# Patient Record
Sex: Female | Born: 1995 | Race: White | Hispanic: No | Marital: Single | Smoking: Never smoker
Health system: Southern US, Community
[De-identification: ages and names within clinical notes are randomized; demographics above are authoritative.]

## PROBLEM LIST (undated history)

## (undated) ENCOUNTER — Inpatient Hospital Stay (HOSPITAL_COMMUNITY): Payer: Self-pay

## (undated) DIAGNOSIS — N809 Endometriosis, unspecified: Secondary | ICD-10-CM

## (undated) DIAGNOSIS — F909 Attention-deficit hyperactivity disorder, unspecified type: Secondary | ICD-10-CM

## (undated) DIAGNOSIS — Z973 Presence of spectacles and contact lenses: Secondary | ICD-10-CM

## (undated) DIAGNOSIS — J302 Other seasonal allergic rhinitis: Secondary | ICD-10-CM

## (undated) DIAGNOSIS — J45909 Unspecified asthma, uncomplicated: Secondary | ICD-10-CM

## (undated) HISTORY — DX: Presence of spectacles and contact lenses: Z97.3

## (undated) HISTORY — PX: ADENOIDECTOMY: SUR15

## (undated) HISTORY — PX: TYMPANOSTOMY TUBE PLACEMENT: SHX32

---
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
## 2016-01-27 ENCOUNTER — Emergency Department (INDEPENDENT_AMBULATORY_CARE_PROVIDER_SITE_OTHER)
Admission: EM | Admit: 2016-01-27 | Discharge: 2016-01-27 | Disposition: A | Discharge status: Home / Self Care | Payer: BLUE CROSS/BLUE SHIELD | Source: Home / Self Care | Attending: Family Medicine | Admitting: Family Medicine

## 2016-01-27 ENCOUNTER — Emergency Department (INDEPENDENT_AMBULATORY_CARE_PROVIDER_SITE_OTHER): Payer: BLUE CROSS/BLUE SHIELD

## 2016-01-27 ENCOUNTER — Encounter: Payer: Self-pay | Admitting: Emergency Medicine

## 2016-01-27 DIAGNOSIS — R1030 Lower abdominal pain, unspecified: Secondary | ICD-10-CM

## 2016-01-27 DIAGNOSIS — R1013 Epigastric pain: Secondary | ICD-10-CM

## 2016-01-27 DIAGNOSIS — G43A0 Cyclical vomiting, not intractable: Secondary | ICD-10-CM

## 2016-01-27 DIAGNOSIS — R51 Headache: Secondary | ICD-10-CM

## 2016-01-27 DIAGNOSIS — R112 Nausea with vomiting, unspecified: Secondary | ICD-10-CM

## 2016-01-27 DIAGNOSIS — R102 Pelvic and perineal pain: Secondary | ICD-10-CM

## 2016-01-27 DIAGNOSIS — M25552 Pain in left hip: Secondary | ICD-10-CM

## 2016-01-27 DIAGNOSIS — R197 Diarrhea, unspecified: Secondary | ICD-10-CM

## 2016-01-27 DIAGNOSIS — R1115 Cyclical vomiting syndrome unrelated to migraine: Secondary | ICD-10-CM

## 2016-01-27 DIAGNOSIS — R519 Headache, unspecified: Secondary | ICD-10-CM

## 2016-01-27 LAB — POCT CBC W AUTO DIFF (K'VILLE URGENT CARE)

## 2016-01-27 LAB — POCT URINALYSIS DIP (MANUAL ENTRY)
Bilirubin, UA: NEGATIVE
Blood, UA: NEGATIVE
Glucose, UA: NEGATIVE
Ketones, POC UA: NEGATIVE
Leukocytes, UA: NEGATIVE
Nitrite, UA: NEGATIVE
Protein Ur, POC: NEGATIVE
Spec Grav, UA: 1.015 (ref 1.005–1.03)
Urobilinogen, UA: 0.2 (ref 0–1)
pH, UA: 7.5 (ref 5–8)

## 2016-01-27 LAB — POCT URINE PREGNANCY: Preg Test, Ur: NEGATIVE

## 2016-01-27 MED ORDER — ONDANSETRON HCL 4 MG PO TABS: 4.0000 mg | ORAL_TABLET | Freq: Four times a day (QID) | ORAL | 0 refills | Status: DC

## 2016-01-27 MED ORDER — DICYCLOMINE HCL 20 MG PO TABS: 20.0000 mg | ORAL_TABLET | Freq: Three times a day (TID) | ORAL | 0 refills | Status: DC

## 2016-01-27 MED ORDER — INFLUENZA VAC SPLIT QUAD 0.5 ML IM SUSY
0.5000 mL | PREFILLED_SYRINGE | INTRAMUSCULAR | Status: AC
  Administered 2016-01-27: 0.5 mL via INTRAMUSCULAR

## 2016-01-27 NOTE — ED Provider Notes (Signed)
CSN: [id number]     Arrival date & time 01/27/16  1448 History   First MD Initiated Contact with Patient 01/27/16 1503     Chief Complaint  Patient presents with  . Nausea  . Emesis  . Headache   (Consider location/radiation/quality/duration/timing/severity/associated sxs/prior Treatment) HPI  Connie Chapman is a 20 y.o. female presenting to UC with c/o intermittent nausea, vomiting, and generalized headache for about 2 weeks.  Pt notes she had similar symptoms about 2 months ago, was evaluated here at [facility]. Normal exam at that time and CBC at that time. She did not f/u with PCP as symptoms eventually resolved, but now have returned.  She was treated for a potential sinus infection at that time.  Today, pt denies nasal congestion, sore throat or cough. Denies urinary or vaginal symptoms.  She is sexually active, not on birth control, and having unprotected intercourse.  She denies concern for STDs.  Per PM[facility], pt does have hx of endometriosis.  Pt notes she is concerned about unexpected weight loss of the last few months as she notes she went from 198 to 182.  Per medical records, pt weighed 183 on 09/16/15.  Today, pt weighs 182.   Past Medical History:  Diagnosis Date  . ADD (attention deficit disorder with hyperactivity)   . Endometriosis   . Seasonal allergies   . Wears glasses    Goes to Dr. Karen Kitchens  . Wears glasses    Past Surgical History:  Procedure Laterality Date  . ADENOIDECTOMY    . TONSILLECTOMY  2007  . TYMPANOSTOMY TUBE PLACEMENT  2000, 2004  . TYMPANOSTOMY TUBE PLACEMENT     Family History  Problem Relation Age of Onset  . Hearing loss Mother   . Scoliosis Mother   . Endometriosis Mother   . Hypertension Mother   . Other Mother     Tumor Cerebri  . Hypertension    . Thyroid disease    . Heart attack     Social History  Substance Use Topics  . Smoking status: Passive Smoke Exposure - Never Smoker  . Smokeless tobacco: Never Used  . Alcohol use No   OB  History    No data available     Review of Systems  Constitutional: Negative for chills, fever and unexpected weight change.  HENT: Negative for congestion, ear pain, sore throat, trouble swallowing and voice change.   Respiratory: Negative for cough and shortness of breath.   Cardiovascular: Negative for chest pain and palpitations.  Gastrointestinal: Positive for abdominal pain, nausea and vomiting. Negative for blood in stool and diarrhea (loose stool).  Genitourinary: Positive for pelvic pain ( lower abdomen). Negative for dysuria, flank pain, frequency, hematuria, menstrual problem and urgency.  Musculoskeletal: Negative for arthralgias, back pain and myalgias.  Skin: Negative for rash.  Neurological: Positive for headaches. Negative for dizziness, seizures, syncope and light-headedness.    Allergies  Codeine  Home Medications   Prior to Admission medications   Medication Sig Start Date End Date Taking? Authorizing Provider  dicyclomine (BENTYL) 20 MG tablet Take 1 tablet (20 mg total) by mouth 4 (four) times daily -  before meals and at bedtime. 01/27/16   Junius Finner, PA-C  ondansetron (ZOFRAN) 4 MG tablet Take 1 tablet (4 mg total) by mouth every 6 (six) hours. 01/27/16   Junius Finner, PA-C   Meds Ordered and Administered this Visit   Medications  Influenza vac split quadrivalent PF (FLUARIX) injection 0.5 mL (0.5 mLs  Intramuscular Given 01/27/16 1702)    BP 133/89 (BP Location: Left Arm)   Temp 97.7 F (36.5 C) (Oral)   Ht 5\' 4"  (1.626 m)   Wt 182 lb (82.6 kg)   LMP 01/02/2016 (Exact Date)   SpO2 99%   BMI 31.24 kg/m  No data found.   Physical Exam  Constitutional: She is oriented to person, place, and time. She appears well-developed and well-nourished. No distress.  Pt sitting on exam bed, NAD  HENT:  Head: Normocephalic and atraumatic.  Mouth/Throat: Oropharynx is clear and moist.  Eyes: Conjunctivae and EOM are normal. Pupils are equal, round, and  reactive to light. No scleral icterus.  Neck: Normal range of motion. Neck supple.  Cardiovascular: Normal rate, regular rhythm and normal heart sounds.   Pulmonary/Chest: Effort normal and breath sounds normal. No respiratory distress. She has no wheezes. She has no rales.  Abdominal: Soft. Bowel sounds are normal. She exhibits no distension and no mass. There is tenderness ( mild LUQ and LLQ). There is no rebound, no guarding and no CVA tenderness.  Musculoskeletal: Normal range of motion.  Neurological: She is alert and oriented to person, place, and time.  Skin: Skin is warm and dry. She is not diaphoretic.  Nursing note and vitals reviewed.   Urgent Care Course   Clinical Course    Procedures (including critical care time)  Labs Review Labs Reviewed  TSH  COMPREHENSIVE METABOLIC PANEL  POCT URINALYSIS DIP (MANUAL ENTRY)  POCT URINE PREGNANCY  POCT CBC W AUTO DIFF ([facility])    Imaging Review US Transvaginal Non-ob  Result Date: 01/27/2016 CLINICAL DATA:  Left lower quadrant pain for 2 weeks EXAM: TRANSABDOMINAL AND TRANSVAGINAL ULTRASOUND OF PELVIS DOPPLER ULTRASOUND OF OVARIES TECHNIQUE: Both transabdominal and transvaginal ultrasound examinations of the pelvis were performed. Transabdominal technique was performed for global imaging of the pelvis including uterus, ovaries, adnexal regions, and pelvic cul-de-sac. It was necessary to proceed with endovaginal exam following the transabdominal exam to visualize the uterus, endometrium and ovaries. Color and duplex Doppler ultrasound was utilized to evaluate blood flow to the ovaries. COMPARISON:  None. FINDINGS: Uterus Measurements: 7.7 x 4.0 x 4.4 cm. No fibroids or other mass visualized. Endometrium Thickness: 10.8 mm.  No focal abnormality visualized. Right ovary Measurements: 3.6 x 2.1 x 2.0 cm. Normal appearance/no adnexal mass. Left ovary Measurements: 3.0 x 1.6 x 1.7 cm. Normal appearance/no adnexal mass. Pulsed  Doppler evaluation of both ovaries demonstrates normal low-resistance arterial and venous waveforms. Other findings Trace free fluid identified within the pelvis. IMPRESSION: 1. No evidence for ovarian torsion. Electronically Signed   By: Signa Kell M.D.   On: 01/27/2016 16:26   US Pelvis Complete  Result Date: 01/27/2016 CLINICAL DATA:  LEFT lower quadrant pelvic pain for 2 weeks. Assess for ovarian torsion. History of endometriosis. EXAM: TRANSABDOMINAL AND TRANSVAGINAL ULTRASOUND OF PELVIS DOPPLER ULTRASOUND OF OVARIES TECHNIQUE: Both transabdominal and transvaginal ultrasound examinations of the pelvis were performed. Transabdominal technique was performed for global imaging of the pelvis including uterus, ovaries, adnexal regions, and pelvic cul-de-sac. It was necessary to proceed with endovaginal exam following the transabdominal exam to visualize the adnexae. Color and duplex Doppler ultrasound was utilized to evaluate blood flow to the ovaries. COMPARISON:  None. FINDINGS: Uterus Measurements: 7.7 x 4 x 4.4 cm. No fibroids or other mass visualized. Endometrium Thickness: 11 mm.  No focal abnormality visualized. Right ovary Measurements: 3.6 x 2.1 x 2 cm. Normal appearance/no adnexal mass. Left ovary  Measurements: 3 x 1.6 x 1.7 cm. Normal appearance/no adnexal mass. Pulsed Doppler evaluation of both ovaries demonstrates normal low-resistance arterial and venous waveforms. Other findings No abnormal free fluid.  Multiple loops of stool-filled bowel. IMPRESSION: Stool-filled bowel could be characterized with radiograph if clinical concern for constipation. Otherwise negative pelvic ultrasound. Electronically Signed   By: Awilda Metroourtnay  Bloomer M.D.   On: 01/27/2016 16:11   [place] Art/ven Flow Abd Pelv Doppler  Result Date: 01/27/2016 CLINICAL DATA:  LEFT lower quadrant pelvic pain for 2 weeks. Assess for ovarian torsion. History of endometriosis. EXAM: TRANSABDOMINAL AND TRANSVAGINAL ULTRASOUND OF PELVIS  DOPPLER ULTRASOUND OF OVARIES TECHNIQUE: Both transabdominal and transvaginal ultrasound examinations of the pelvis were performed. Transabdominal technique was performed for global imaging of the pelvis including uterus, ovaries, adnexal regions, and pelvic cul-de-sac. It was necessary to proceed with endovaginal exam following the transabdominal exam to visualize the adnexae. Color and duplex Doppler ultrasound was utilized to evaluate blood flow to the ovaries. COMPARISON:  None. FINDINGS: Uterus Measurements: 7.7 x 4 x 4.4 cm. No fibroids or other mass visualized. Endometrium Thickness: 11 mm.  No focal abnormality visualized. Right ovary Measurements: 3.6 x 2.1 x 2 cm. Normal appearance/no adnexal mass. Left ovary Measurements: 3 x 1.6 x 1.7 cm. Normal appearance/no adnexal mass. Pulsed Doppler evaluation of both ovaries demonstrates normal low-resistance arterial and venous waveforms. Other findings No abnormal free fluid.  Multiple loops of stool-filled bowel. IMPRESSION: Stool-filled bowel could be characterized with radiograph if clinical concern for constipation. Otherwise negative pelvic ultrasound. Electronically Signed   By: Awilda Metroourtnay  Bloomer M.D.   On: 01/27/2016 16:11     MDM   1. Non-intractable cyclical vomiting with nausea   2. Nausea vomiting and diarrhea   3. Lower abdominal pain   4. Epigastric pain   5. Pelvic joint pain, left   6. Generalized headache    Pt c/o n/v/d, generalized headache, epigastric pain and lower abdominal pain for about 2 weeks. Hx of same in the past, has not f/u with PCP. Pt does have unprotected intercourse but denies vaginal symptoms. Denies concern for STD. Pt appears well hydrated, NAD. Vitals: WNL  U/S- normal, mention of stool, pt declines feeling constipated.  Does not want to be tested for STDs. Declined pelvic exam, hx of endometriosis, plans to f/u with OB/GYN  No vomiting while in UC, able to keep down several ounces of water and ginger-ale.    CBC- unremarkable  CMP and TSH- pending  Doubt emergent process taking place at this time.   Rx: Zofran and Bentyl Encouraged to schedule f/u appointment with PCP even if feeling better as well as with GYN Patient verbalized understanding and agreement with treatment plan.    Junius FinnerErin O'Malley, PA-C 01/27/16 1718

## 2016-01-27 NOTE — ED Triage Notes (Signed)
Constant nausea, vomiting after meals, headaches, body aches, dull pain in lower abdomen x 2 weeks. Sexually active does not use protection.

## 2016-01-28 ENCOUNTER — Telehealth: Payer: Self-pay | Admitting: *Deleted

## 2016-01-28 LAB — TSH: TSH: 1.26 mIU/L (ref 0.50–4.30)

## 2016-01-28 LAB — COMPREHENSIVE METABOLIC PANEL
ALT: 18 U/L (ref 5–32)
AST: 20 U/L (ref 12–32)
Albumin: 4.1 g/dL (ref 3.6–5.1)
Alkaline Phosphatase: 62 U/L (ref 47–176)
BUN: 9 mg/dL (ref 7–20)
CO2: 23 mmol/L (ref 20–31)
Calcium: 9 mg/dL (ref 8.9–10.4)
Chloride: 107 mmol/L (ref 98–110)
Creat: 0.62 mg/dL (ref 0.50–1.00)
Glucose, Bld: 76 mg/dL (ref 65–99)
Potassium: 4.1 mmol/L (ref 3.8–5.1)
Sodium: 138 mmol/L (ref 135–146)
Total Bilirubin: 0.5 mg/dL (ref 0.2–1.1)
Total Protein: 6.9 g/dL (ref 6.3–8.2)

## 2016-01-28 NOTE — Telephone Encounter (Signed)
Callback: No answer, no VM available.

## 2016-01-29 ENCOUNTER — Telehealth: Payer: Self-pay | Admitting: *Deleted

## 2016-01-29 NOTE — Telephone Encounter (Signed)
LM with pts father to have her call back for lab results. Clemens Catholichristy Livier Hendel, LPN

## 2016-07-20 ENCOUNTER — Encounter: Payer: Self-pay | Admitting: *Deleted

## 2016-07-20 ENCOUNTER — Emergency Department (INDEPENDENT_AMBULATORY_CARE_PROVIDER_SITE_OTHER)
Admission: EM | Admit: 2016-07-20 | Discharge: 2016-07-20 | Disposition: A | Discharge status: Home / Self Care | Payer: BLUE CROSS/BLUE SHIELD | Source: Home / Self Care | Attending: Family Medicine | Admitting: Family Medicine

## 2016-07-20 DIAGNOSIS — R6889 Other general symptoms and signs: Secondary | ICD-10-CM

## 2016-07-20 DIAGNOSIS — R197 Diarrhea, unspecified: Secondary | ICD-10-CM

## 2016-07-20 DIAGNOSIS — R112 Nausea with vomiting, unspecified: Secondary | ICD-10-CM

## 2016-07-20 MED ORDER — OSELTAMIVIR PHOSPHATE 75 MG PO CAPS: 75.0000 mg | ORAL_CAPSULE | Freq: Two times a day (BID) | ORAL | 0 refills | Status: DC

## 2016-07-20 MED ORDER — PROMETHAZINE HCL 25 MG PO TABS: 25.0000 mg | ORAL_TABLET | Freq: Four times a day (QID) | ORAL | 0 refills | Status: DC | PRN

## 2016-07-20 NOTE — Discharge Instructions (Signed)
You may take 400-600mg  Ibuprofen (Motrin) every 6-8 hours for fever and pain  Alternate with Tylenol  You may take  Tylenol every 4-6 hours as needed for fever and pain  Follow-up with your primary care provider next week for recheck of symptoms if not improving.  Be sure to drink plenty of fluids and rest, at least 8hrs of sleep a night, preferably more while you are sick. Return urgent care or go to closest ER if you cannot keep down fluids/signs of dehydration, fever not reducing with Tylenol, difficulty breathing/wheezing, stiff neck, worsening condition, or other concerns (see below)   For the diarrhea you can try over the counter imodium or pepto-bismol if having more than 5-6 episodes a day.  Oseltamivir (Tamiflu) is an antiviral medication that can help decrease symptoms of the flu by about 1 days and lessen severity of symptoms.  It is best to start this medication within first 48 hours of symptoms, however, some studies have shown relief even after the 48 hour time frame.  This medication may cause stomach upset including nausea, vomiting and diarrhea.  It may also cause dizziness or hallucinations in children.  To help prevent stomach upset, you may take this medication with food.  If you are still having unwanted symptoms, you may stop taking this medication as it is not as important to finish the entire course like antibiotics.  If you have questions/concerns please call our office or follow up with your primary care provider.

## 2016-07-20 NOTE — ED Provider Notes (Signed)
CSN: [id number]     Arrival date & time 07/20/16  1413 History   First MD Initiated Contact with Patient 07/20/16 1432     Chief Complaint  Patient presents with  . Diarrhea  . Emesis   (Consider location/radiation/quality/duration/timing/severity/associated sxs/prior Treatment) HPI Connie Chapman is a 21 y.o. female presenting to UC with c/o 3 days of n/v/d, nasal congestion, cough, rhinorrhea and body aches with fever up to 102*F at night only.  She has had 2-3 episodes of vomiting and 6-7 episodes of watery diarrhea today. No blood or mucous in the stool.  Denies known sick contacts. Denies chest pain or SOB. Denies abdominal pain at this time.     Past Medical History:  Diagnosis Date  . ADD (attention deficit disorder with hyperactivity)   . Endometriosis   . Seasonal allergies   . Wears glasses    Goes to Dr. Karen Kitchens  . Wears glasses    Past Surgical History:  Procedure Laterality Date  . ADENOIDECTOMY    . TONSILLECTOMY  2007  . TYMPANOSTOMY TUBE PLACEMENT  2000, 2004  . TYMPANOSTOMY TUBE PLACEMENT     Family History  Problem Relation Age of Onset  . Hearing loss Mother   . Scoliosis Mother   . Endometriosis Mother   . Hypertension Mother   . Other Mother     Tumor Cerebri  . Hypertension    . Thyroid disease    . Heart attack     Social History  Substance Use Topics  . Smoking status: Passive Smoke Exposure - Never Smoker  . Smokeless tobacco: Never Used  . Alcohol use No   OB History    No data available     Review of Systems  Constitutional: Positive for chills and fever.  HENT: Positive for congestion and rhinorrhea. Negative for ear pain, sore throat, trouble swallowing and voice change.   Respiratory: Positive for cough. Negative for shortness of breath.   Cardiovascular: Negative for chest pain and palpitations.  Gastrointestinal: Positive for diarrhea, nausea and vomiting. Negative for abdominal pain.  Musculoskeletal: Positive for  arthralgias, back pain and myalgias.  Skin: Negative for rash.  Neurological: Positive for headaches. Negative for dizziness and light-headedness.    Allergies  Codeine  Home Medications   Prior to Admission medications   Medication Sig Start Date End Date Taking? Authorizing Provider  oseltamivir (TAMIFLU) 75 MG capsule Take 1 capsule (75 mg total) by mouth every 12 (twelve) hours. 07/20/16   Junius Finner, PA-C  promethazine (PHENERGAN) 25 MG tablet Take 1 tablet (25 mg total) by mouth every 6 (six) hours as needed. 07/20/16   Junius Finner, PA-C   Meds Ordered and Administered this Visit  Medications - No data to display  BP 129/90 (BP Location: Left Arm)   Pulse 90   Temp 97.9 F (36.6 C) (Oral)   Resp 16   Ht  (1.651 m)   Wt 190 lb (86.2 kg)   LMP 07/13/2016   SpO2 97%   BMI 31.62 kg/m  No data found.   Physical Exam  Constitutional: She is oriented to person, place, and time. She appears well-developed and well-nourished. No distress.  Pt sitting on exam bed sipping on ginger-ale. NAD  HENT:  Head: Normocephalic and atraumatic.  Right Ear: Tympanic membrane normal.  Left Ear: Tympanic membrane normal.  Nose: Nose normal.  Mouth/Throat: Uvula is midline, oropharynx is clear and moist and mucous membranes are normal.  Eyes: EOM are  normal.  Neck: Normal range of motion. Neck supple.  Cardiovascular: Normal rate and regular rhythm.   Pulmonary/Chest: Effort normal and breath sounds normal. No stridor. No respiratory distress. She has no wheezes. She has no rales.  Abdominal: Soft. She exhibits no distension and no mass. There is no tenderness. There is no rebound and no guarding.  Musculoskeletal: Normal range of motion.  Lymphadenopathy:    She has no cervical adenopathy.  Neurological: She is alert and oriented to person, place, and time.  Skin: Skin is warm and dry. She is not diaphoretic.  Psychiatric: She has a normal mood and affect. Her behavior is  normal.  Nursing note and vitals reviewed.   Urgent Care Course     Procedures (including critical care time)  Labs Review Labs Reviewed - No data to display  Imaging Review No results found.   MDM   1. Flu-like symptoms   2. Nausea vomiting and diarrhea    Vitals: unremarkable  Hx and exam c/w influenza. There have been several positive cases of influenza B within the last 2 weeks.  Will treat for potential flu.  Rx: Tamiflu and phenergan  Home care instructions provided. f/u with PCP later this week if not improving.     Junius Finner, PA-C 07/20/16 1724

## 2016-07-20 NOTE — ED Triage Notes (Signed)
Pt c/o diarrhea and vomiting x 3 days with fever up to 102 only at night. She reports cough and runny nose today.

## 2017-01-11 ENCOUNTER — Ambulatory Visit: Payer: BLUE CROSS/BLUE SHIELD | Admitting: Family Medicine

## 2017-01-11 DIAGNOSIS — Z0189 Encounter for other specified special examinations: Secondary | ICD-10-CM

## 2017-01-11 NOTE — Progress Notes (Deleted)
   Subjective:    Patient ID: Connie Chapman, female    DOB: July 17, 1995, 21 y.o.   MRN: [id number]  HPI F/U ADD -    Review of Systems     Objective:   Physical Exam  Constitutional: She is oriented to person, place, and time. She appears well-developed and well-nourished.  HENT:  Head: Normocephalic and atraumatic.  Cardiovascular: Normal rate, regular rhythm and normal heart sounds.   Pulmonary/Chest: Effort normal and breath sounds normal.  Neurological: She is alert and oriented to person, place, and time.  Skin: Skin is warm and dry.  Psychiatric: She has a normal mood and affect. Her behavior is normal.          Assessment & Plan:  ADD -

## 2017-06-07 ENCOUNTER — Encounter: Payer: Self-pay | Admitting: Physician Assistant

## 2017-06-07 ENCOUNTER — Ambulatory Visit (INDEPENDENT_AMBULATORY_CARE_PROVIDER_SITE_OTHER): Payer: BLUE CROSS/BLUE SHIELD | Admitting: Physician Assistant

## 2017-06-07 VITALS — BP 136/84 | HR 85 | Ht 65.0 in | Wt 188.0 lb

## 2017-06-07 DIAGNOSIS — N898 Other specified noninflammatory disorders of vagina: Secondary | ICD-10-CM

## 2017-06-07 DIAGNOSIS — R300 Dysuria: Secondary | ICD-10-CM | POA: Diagnosis not present

## 2017-06-07 LAB — POCT URINALYSIS DIPSTICK
BILIRUBIN UA: NEGATIVE
Blood, UA: NEGATIVE
Glucose, UA: NEGATIVE
KETONES UA: NEGATIVE
Leukocytes, UA: NEGATIVE
Nitrite, UA: NEGATIVE
PROTEIN UA: 30
Urobilinogen, UA: 0.2 E.U./dL
pH, UA: 6 (ref 5.0–8.0)

## 2017-06-07 MED ORDER — FLUCONAZOLE 150 MG PO TABS: 150.0000 mg | ORAL_TABLET | Freq: Once | ORAL | 0 refills | Status: AC

## 2017-06-07 MED ORDER — PHENAZOPYRIDINE HCL 200 MG PO TABS: 200.0000 mg | ORAL_TABLET | Freq: Three times a day (TID) | ORAL | 0 refills | Status: AC

## 2017-06-07 NOTE — Patient Instructions (Addendum)
Call [phone] Vaginal Yeast infection, Adult Vaginal yeast infection is a condition that causes soreness, swelling, and redness (inflammation) of the vagina. It also causes vaginal discharge. This is a common condition. Some women get this infection frequently. What are the causes? This condition is caused by a change in the normal balance of the yeast (candida) and bacteria that live in the vagina. This change causes an overgrowth of yeast, which causes the inflammation. What increases the risk? This condition is more likely to develop in:  Women who take antibiotic medicines.  Women who have diabetes.  Women who take birth control pills.  Women who are pregnant.  Women who douche often.  Women who have a weak defense (immune) system.  Women who have been taking steroid medicines for a long time.  Women who frequently wear tight clothing.  What are the signs or symptoms? Symptoms of this condition include:  White, thick vaginal discharge.  Swelling, itching, redness, and irritation of the vagina. The lips of the vagina (vulva) may be affected as well.  Pain or a burning feeling while urinating.  Pain during sex.  How is this diagnosed? This condition is diagnosed with a medical history and physical exam. This will include a pelvic exam. Your health care provider will examine a sample of your vaginal discharge under a microscope. Your health care provider may send this sample for testing to confirm the diagnosis. How is this treated? This condition is treated with medicine. Medicines may be over-the-counter or prescription. You may be told to use one or more of the following:  Medicine that is taken orally.  Medicine that is applied as a cream.  Medicine that is inserted directly into the vagina (suppository).  Follow these instructions at home:  Take or apply over-the-counter and prescription medicines only as told by your health care provider.  Do not have sex  until your health care provider has approved. Tell your sex partner that you have a yeast infection. That person should go to his or her health care provider if he or she develops symptoms.  Do not wear tight clothes, such as pantyhose or tight pants.  Avoid using tampons until your health care provider approves.  Eat more yogurt. This may help to keep your yeast infection from returning.  Try taking a sitz bath to help with discomfort. This is a warm water bath that is taken while you are sitting down. The water should only come up to your hips and should cover your buttocks. Do this 3-4 times per day or as told by your health care provider.  Do not douche.  Wear breathable, cotton underwear.  If you have diabetes, keep your blood sugar levels under control. Contact a health care provider if:  You have a fever.  Your symptoms go away and then return.  Your symptoms do not get better with treatment.  Your symptoms get worse.  You have new symptoms.  You develop blisters in or around your vagina.  You have blood coming from your vagina and it is not your menstrual period.  You develop pain in your abdomen. This information is not intended to replace advice given to you by your health care provider. Make sure you discuss any questions you have with your health care provider. Document Released: 01/07/2005 Document Revised: 09/11/2015 Document Reviewed: 10/01/2014 Elsevier Interactive Patient Education  2018 [employer]
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
## 2017-06-08 ENCOUNTER — Other Ambulatory Visit: Payer: Self-pay | Admitting: Physician Assistant

## 2017-06-08 LAB — WET PREP FOR TRICH, YEAST, CLUE
MICRO NUMBER: 90247486
Specimen Quality: ADEQUATE

## 2017-06-08 MED ORDER — METRONIDAZOLE 500 MG PO TABS: 500.0000 mg | ORAL_TABLET | Freq: Two times a day (BID) | ORAL | 0 refills | Status: DC

## 2017-06-08 NOTE — Progress Notes (Signed)
Call pt: BV detected. Will treat with metronidazole 500mg  bid for 7 days if symptoms persist or worsen please follow up for STD testing.

## 2017-06-08 NOTE — Progress Notes (Signed)
met

## 2017-06-09 LAB — URINE CULTURE
MICRO NUMBER: 90244809
SPECIMEN QUALITY:: ADEQUATE

## 2017-06-09 NOTE — Progress Notes (Signed)
Call pt: no bacterial infected detected on culture.

## 2017-06-24 ENCOUNTER — Emergency Department (INDEPENDENT_AMBULATORY_CARE_PROVIDER_SITE_OTHER)
Admission: EM | Admit: 2017-06-24 | Discharge: 2017-06-24 | Disposition: A | Discharge status: Home / Self Care | Payer: BLUE CROSS/BLUE SHIELD | Source: Home / Self Care | Attending: Emergency Medicine | Admitting: Emergency Medicine

## 2017-06-24 ENCOUNTER — Other Ambulatory Visit: Payer: Self-pay

## 2017-06-24 ENCOUNTER — Encounter: Payer: Self-pay | Admitting: Emergency Medicine

## 2017-06-24 DIAGNOSIS — R197 Diarrhea, unspecified: Secondary | ICD-10-CM

## 2017-06-24 DIAGNOSIS — J029 Acute pharyngitis, unspecified: Secondary | ICD-10-CM | POA: Diagnosis not present

## 2017-06-24 DIAGNOSIS — R1115 Cyclical vomiting syndrome unrelated to migraine: Secondary | ICD-10-CM

## 2017-06-24 LAB — POCT RAPID STREP A (OFFICE): Rapid Strep A Screen: NEGATIVE

## 2017-06-24 LAB — POCT INFLUENZA A/B
Influenza A, POC: NEGATIVE
Influenza B, POC: NEGATIVE

## 2017-06-24 MED ORDER — HYOSCYAMINE SULFATE SL 0.125 MG SL SUBL: SUBLINGUAL_TABLET | SUBLINGUAL | 0 refills | Status: DC

## 2017-06-24 MED ORDER — ONDANSETRON 8 MG PO TBDP: ORAL_TABLET | ORAL | 0 refills | Status: DC

## 2017-06-24 NOTE — Discharge Instructions (Signed)
You have Zofran to take for nausea. Be sure you drink lots of clear liquids. You have a Levsin to use for crampy abdominal pain. Return to clinic if you develop bloody diarrhea or do not improve in the next 48 hours.
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
## 2017-06-24 NOTE — ED Triage Notes (Signed)
Reports 4 days of diarrhea, sore throat, and headache. Took ibuprofen at 1530.

## 2017-06-25 ENCOUNTER — Telehealth: Payer: Self-pay

## 2017-06-25 LAB — STREP A DNA PROBE: GROUP A STREP PROBE: NOT DETECTED

## 2017-06-25 NOTE — Telephone Encounter (Signed)
Called patient, feeling better.  Lab results given.

## 2017-06-30 ENCOUNTER — Ambulatory Visit (HOSPITAL_COMMUNITY): Payer: BLUE CROSS/BLUE SHIELD | Admitting: Psychiatry

## 2017-08-31 ENCOUNTER — Emergency Department (INDEPENDENT_AMBULATORY_CARE_PROVIDER_SITE_OTHER)
Admission: EM | Admit: 2017-08-31 | Discharge: 2017-08-31 | Disposition: A | Discharge status: Home / Self Care | Payer: BLUE CROSS/BLUE SHIELD | Source: Home / Self Care | Attending: Family Medicine | Admitting: Family Medicine

## 2017-08-31 ENCOUNTER — Other Ambulatory Visit: Payer: Self-pay

## 2017-08-31 DIAGNOSIS — R519 Headache, unspecified: Secondary | ICD-10-CM

## 2017-08-31 DIAGNOSIS — R109 Unspecified abdominal pain: Secondary | ICD-10-CM

## 2017-08-31 DIAGNOSIS — N898 Other specified noninflammatory disorders of vagina: Secondary | ICD-10-CM

## 2017-08-31 DIAGNOSIS — R51 Headache: Secondary | ICD-10-CM

## 2017-08-31 LAB — POCT WET + KOH PREP: Trich by wet prep: ABSENT

## 2017-08-31 LAB — POCT URINALYSIS DIP (MANUAL ENTRY)
BILIRUBIN UA: NEGATIVE
GLUCOSE UA: NEGATIVE mg/dL
Ketones, POC UA: NEGATIVE mg/dL
Leukocytes, UA: NEGATIVE
NITRITE UA: NEGATIVE
Protein Ur, POC: 30 mg/dL — AB
RBC UA: NEGATIVE
SPEC GRAV UA: 1.025 (ref 1.010–1.025)
Urobilinogen, UA: 0.2 E.U./dL — AB
pH, UA: 6.5 (ref 5.0–8.0)

## 2017-08-31 LAB — POCT URINE PREGNANCY: Preg Test, Ur: NEGATIVE

## 2017-08-31 MED ORDER — FLUCONAZOLE 150 MG PO TABS: ORAL_TABLET | ORAL | 0 refills | Status: DC

## 2017-08-31 NOTE — ED Triage Notes (Signed)
Pt has had lower abdominal pain for about 1 week.  Dark brown vaginal discharge about the same time.  Denies concern for STD.
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
## 2017-08-31 NOTE — Discharge Instructions (Addendum)
For headache, may take Ibuprofen , 4 tabs every 8 hours with food, or Aleve 2 tabs every 12 hours as needed.

## 2017-09-01 ENCOUNTER — Ambulatory Visit: Payer: BLUE CROSS/BLUE SHIELD | Admitting: Physician Assistant

## 2017-11-16 ENCOUNTER — Emergency Department
Admission: EM | Admit: 2017-11-16 | Discharge: 2017-11-16 | Disposition: A | Discharge status: Home / Self Care | Payer: BLUE CROSS/BLUE SHIELD | Attending: Emergency Medicine | Admitting: Emergency Medicine

## 2017-11-16 ENCOUNTER — Emergency Department: Payer: BLUE CROSS/BLUE SHIELD

## 2017-11-16 ENCOUNTER — Other Ambulatory Visit: Payer: Self-pay

## 2017-11-16 DIAGNOSIS — R569 Unspecified convulsions: Secondary | ICD-10-CM

## 2017-11-16 DIAGNOSIS — R55 Syncope and collapse: Secondary | ICD-10-CM | POA: Diagnosis not present

## 2017-11-16 DIAGNOSIS — N809 Endometriosis, unspecified: Secondary | ICD-10-CM | POA: Insufficient documentation

## 2017-11-16 DIAGNOSIS — Z7722 Contact with and (suspected) exposure to environmental tobacco smoke (acute) (chronic): Secondary | ICD-10-CM | POA: Insufficient documentation

## 2017-11-16 LAB — URINALYSIS, COMPLETE (UACMP) WITH MICROSCOPIC
BILIRUBIN URINE: NEGATIVE
GLUCOSE, UA: NEGATIVE mg/dL
HGB URINE DIPSTICK: NEGATIVE
KETONES UR: 5 mg/dL — AB
Leukocytes, UA: NEGATIVE
NITRITE: NEGATIVE
Protein, ur: 30 mg/dL — AB
SPECIFIC GRAVITY, URINE: 1.03 (ref 1.005–1.030)
pH: 6 (ref 5.0–8.0)

## 2017-11-16 LAB — BASIC METABOLIC PANEL
Anion gap: 7 (ref 5–15)
BUN: 12 mg/dL (ref 6–20)
CALCIUM: 8.3 mg/dL — AB (ref 8.9–10.3)
CHLORIDE: 110 mmol/L (ref 98–111)
CO2: 23 mmol/L (ref 22–32)
CREATININE: 0.57 mg/dL (ref 0.44–1.00)
GFR calc non Af Amer: >60 mL/min (ref >60)
GLUCOSE: 109 mg/dL — AB (ref 70–99)
Potassium: 3.8 mmol/L (ref 3.5–5.1)
Sodium: 140 mmol/L (ref 135–145)

## 2017-11-16 LAB — CBC WITH DIFFERENTIAL/PLATELET
Basophils Absolute: 0 10*3/uL (ref 0–0.1)
Basophils Relative: 0 %
EOS ABS: 0.3 10*3/uL (ref 0–0.7)
Eosinophils Relative: 2 %
HCT: 42.3 % (ref 35.0–47.0)
Hemoglobin: 14.4 g/dL (ref 12.0–16.0)
LYMPHS ABS: 1.8 10*3/uL (ref 1.0–3.6)
Lymphocytes Relative: 12 %
MCH: 28.6 pg (ref 26.0–34.0)
MCHC: 34 g/dL (ref 32.0–36.0)
MCV: 84.1 fL (ref 80.0–100.0)
MONO ABS: 0.7 10*3/uL (ref 0.2–0.9)
MONOS PCT: 5 %
Neutro Abs: 12.6 10*3/uL — ABNORMAL HIGH (ref 1.4–6.5)
Neutrophils Relative %: 81 %
PLATELETS: 276 10*3/uL (ref 150–440)
RBC: 5.04 MIL/uL (ref 3.80–5.20)
RDW: 13.8 % (ref 11.5–14.5)
WBC: 15.4 10*3/uL — ABNORMAL HIGH (ref 3.6–11.0)

## 2017-11-16 LAB — POCT PREGNANCY, URINE: PREG TEST UR: NEGATIVE

## 2017-11-16 MED ORDER — DIPHENHYDRAMINE HCL 50 MG/ML IJ SOLN
25.0000 mg | Freq: Once | INTRAMUSCULAR | Status: AC
  Administered 2017-11-16: 25 mg via INTRAVENOUS
  Filled 2017-11-16: qty 1

## 2017-11-16 MED ORDER — METOCLOPRAMIDE HCL 5 MG/ML IJ SOLN
10.0000 mg | Freq: Once | INTRAMUSCULAR | Status: AC
  Administered 2017-11-16: 10 mg via INTRAVENOUS
  Filled 2017-11-16: qty 2

## 2017-11-16 MED ORDER — SODIUM CHLORIDE 0.9 % IV BOLUS
1000.0000 mL | Freq: Once | INTRAVENOUS | Status: AC
  Administered 2017-11-16: 1000 mL via INTRAVENOUS
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
## 2017-11-16 NOTE — ED Triage Notes (Signed)
Pt arrived via  EMS [place] [address] with c/o seizure. EMS states that pt was driving with fiance and she started twitching and hyperventilating. Pt states that she has pain in head.

## 2017-11-16 NOTE — ED Notes (Signed)
Patient transported to CT 

## 2017-11-16 NOTE — Discharge Instructions (Signed)
Your symptoms today are likely due to dehydration from plasma donation, but you will need to follow-up with neurology for further evaluation on the possibility of a seizure.  Until they sits okay, you should avoid driving or swimming or any potentially dangerous activity due to the risk to yourself or others if you should have a seizure in the midst of these circumstances.

## 2017-11-16 NOTE — ED Provider Notes (Signed)
-----------------------------------------   10:01 PM on 11/16/2017 -----------------------------------------  Patient is feeling better after fluids is requesting discharge home.   Minna AntisPaduchowski, Amany Rando, MD 11/16/17 2201

## 2017-11-23 ENCOUNTER — Emergency Department (INDEPENDENT_AMBULATORY_CARE_PROVIDER_SITE_OTHER)
Admission: EM | Admit: 2017-11-23 | Discharge: 2017-11-23 | Disposition: A | Discharge status: Home / Self Care | Payer: BLUE CROSS/BLUE SHIELD | Source: Home / Self Care | Attending: Family Medicine | Admitting: Family Medicine

## 2017-11-23 ENCOUNTER — Other Ambulatory Visit: Payer: Self-pay

## 2017-11-23 DIAGNOSIS — Z87898 Personal history of other specified conditions: Secondary | ICD-10-CM

## 2017-11-23 DIAGNOSIS — G43C0 Periodic headache syndromes in child or adult, not intractable: Secondary | ICD-10-CM | POA: Diagnosis not present

## 2017-11-23 MED ORDER — KETOROLAC TROMETHAMINE 60 MG/2ML IM SOLN
60.0000 mg | Freq: Once | INTRAMUSCULAR | Status: AC
  Administered 2017-11-23: 60 mg via INTRAMUSCULAR

## 2017-11-23 MED ORDER — SUMATRIPTAN SUCCINATE 25 MG PO TABS: ORAL_TABLET | ORAL | 0 refills | Status: DC

## 2017-11-23 MED ORDER — DEXAMETHASONE SODIUM PHOSPHATE 10 MG/ML IJ SOLN
10.0000 mg | Freq: Once | INTRAMUSCULAR | Status: AC
  Administered 2017-11-23: 10 mg via INTRAMUSCULAR

## 2017-11-23 NOTE — Discharge Instructions (Signed)
Follow enclosed instructions for recurrent nosebleeds. Begin Imitrex at first sign of a headache.
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
## 2017-11-23 NOTE — ED Triage Notes (Signed)
Pt c/o headaches and nose bleeds x 3-4 after her hospital visit. Nose bleeds are lasting more than 15 mins at a time. Headache pain level 7/10. Takes advil prn

## 2017-12-10 ENCOUNTER — Other Ambulatory Visit: Payer: Self-pay

## 2017-12-10 ENCOUNTER — Emergency Department (INDEPENDENT_AMBULATORY_CARE_PROVIDER_SITE_OTHER)
Admission: EM | Admit: 2017-12-10 | Discharge: 2017-12-10 | Disposition: A | Discharge status: Home / Self Care | Payer: BLUE CROSS/BLUE SHIELD | Source: Home / Self Care | Attending: Family Medicine | Admitting: Family Medicine

## 2017-12-10 DIAGNOSIS — S8001XA Contusion of right knee, initial encounter: Secondary | ICD-10-CM | POA: Diagnosis not present

## 2017-12-10 DIAGNOSIS — S335XXA Sprain of ligaments of lumbar spine, initial encounter: Secondary | ICD-10-CM

## 2017-12-10 MED ORDER — CYCLOBENZAPRINE HCL 10 MG PO TABS: 10.0000 mg | ORAL_TABLET | Freq: Three times a day (TID) | ORAL | 0 refills | Status: DC | PRN

## 2017-12-10 MED ORDER — HYDROCODONE-ACETAMINOPHEN 5-325 MG PO TABS: 1.0000 | ORAL_TABLET | Freq: Four times a day (QID) | ORAL | 0 refills | Status: DC | PRN

## 2017-12-10 NOTE — Discharge Instructions (Addendum)
Apply ice pack for 30 minutes every 1 to 2 hours today and tomorrow.  Elevate.  Use crutches for 5 to 7 days.  Wear Ace wrap until swelling decreases.  Wear brace for about 10 to 14 days.  Begin range of motion and stretching exercises in about 5 days as tolerated.  Continue ibuprofen 800mg  every 8 hours.
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
## 2018-01-17 ENCOUNTER — Ambulatory Visit: Payer: BLUE CROSS/BLUE SHIELD | Admitting: Family Medicine

## 2018-01-17 NOTE — Progress Notes (Deleted)
   Subjective:    Patient ID: Connie Chapman, female    DOB: Sep 08, 1995, 23 y.o.   MRN: [id number]  HPI    Review of Systems     Objective:   Physical Exam        Assessment & Plan:

## 2018-02-09 ENCOUNTER — Encounter: Payer: BLUE CROSS/BLUE SHIELD | Admitting: Family Medicine

## 2018-02-14 ENCOUNTER — Encounter: Payer: BLUE CROSS/BLUE SHIELD | Admitting: Family Medicine

## 2018-03-07 ENCOUNTER — Encounter: Payer: Self-pay | Admitting: Family Medicine

## 2018-03-07 ENCOUNTER — Emergency Department (INDEPENDENT_AMBULATORY_CARE_PROVIDER_SITE_OTHER)
Admission: EM | Admit: 2018-03-07 | Discharge: 2018-03-07 | Disposition: A | Discharge status: Home / Self Care | Payer: BLUE CROSS/BLUE SHIELD | Source: Home / Self Care

## 2018-03-07 ENCOUNTER — Other Ambulatory Visit: Payer: Self-pay

## 2018-03-07 DIAGNOSIS — J40 Bronchitis, not specified as acute or chronic: Secondary | ICD-10-CM

## 2018-03-07 MED ORDER — PREDNISONE 20 MG PO TABS: ORAL_TABLET | ORAL | 1 refills | Status: DC

## 2018-03-07 MED ORDER — HYDROCODONE-HOMATROPINE 5-1.5 MG/5ML PO SYRP: 5.0000 mL | ORAL_SOLUTION | Freq: Four times a day (QID) | ORAL | 0 refills | Status: DC | PRN

## 2018-03-07 MED ORDER — AZITHROMYCIN 250 MG PO TABS: ORAL_TABLET | ORAL | 0 refills | Status: DC

## 2018-03-07 MED ORDER — ALBUTEROL SULFATE HFA 108 (90 BASE) MCG/ACT IN AERS: 2.0000 | INHALATION_SPRAY | RESPIRATORY_TRACT | 1 refills | Status: DC | PRN

## 2018-03-07 NOTE — ED Triage Notes (Signed)
Started 3 days ago with wheezing on exertion.  Now having sore throat, chest burning, cough, runny nose, and achy.  Denies fever.
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
## 2019-04-06 IMAGING — CT CT HEAD W/O CM
3 series · 15 of 47 positions shown, 18 images · non-contrast
Comparison: CT scan of November 18, 2016.

CLINICAL DATA: Headache.

EXAM:
CT HEAD WITHOUT CONTRAST
TECHNIQUE: Contiguous axial images were obtained from the base of the skull
through the vertex without intravenous contrast.

[Series 2: head wo · axial · 0.47mm/px · z∈[-145,-20]mm · 9 of 30 slices shown, 12 images]
[im 3/30  brain]
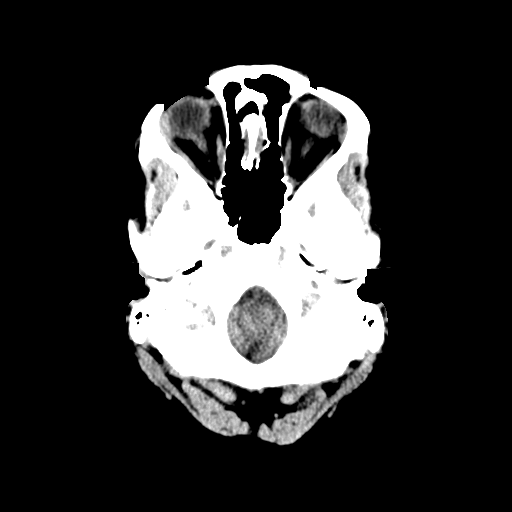
[im 3/30  bone]
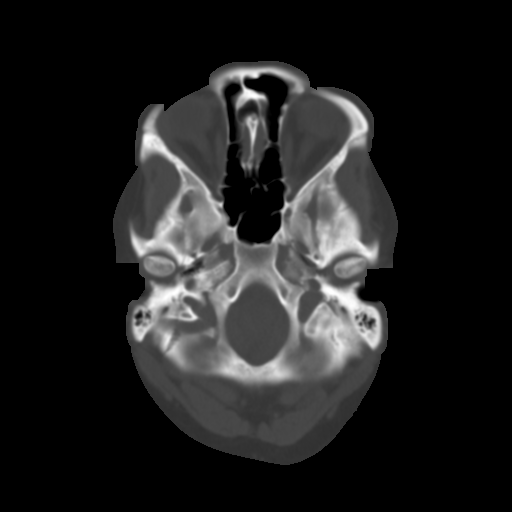
[im 6/30  brain]
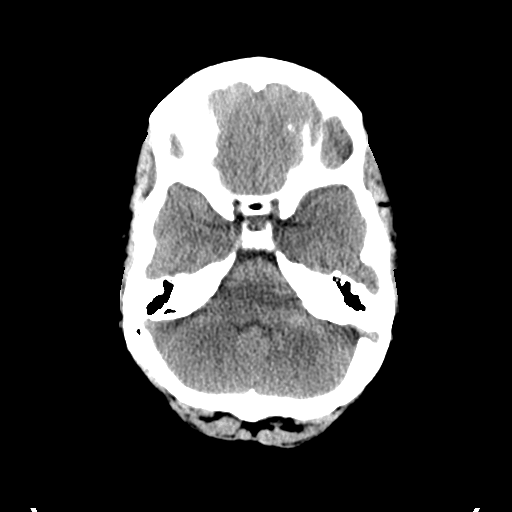
[im 9/30  brain]
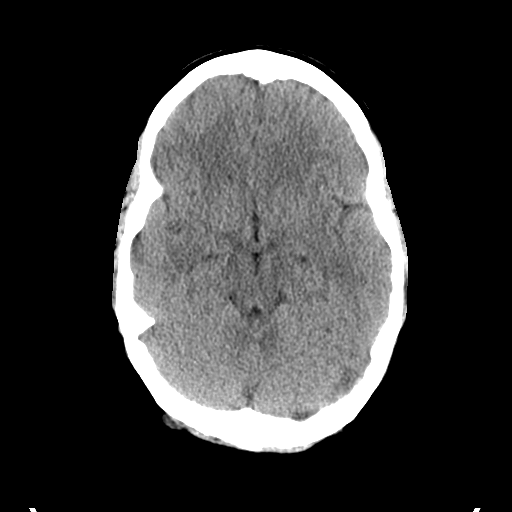
[im 12/30  brain]
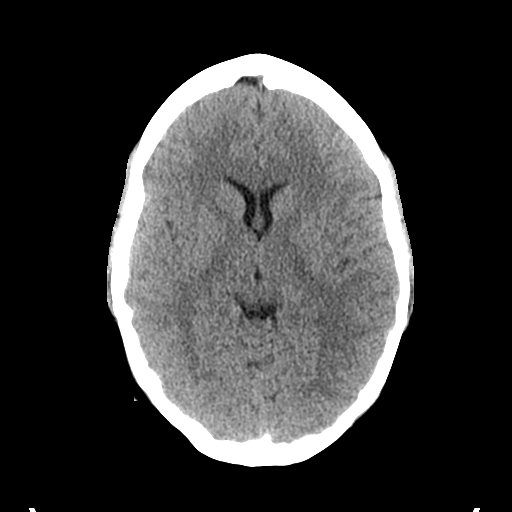
[im 16/30  brain]
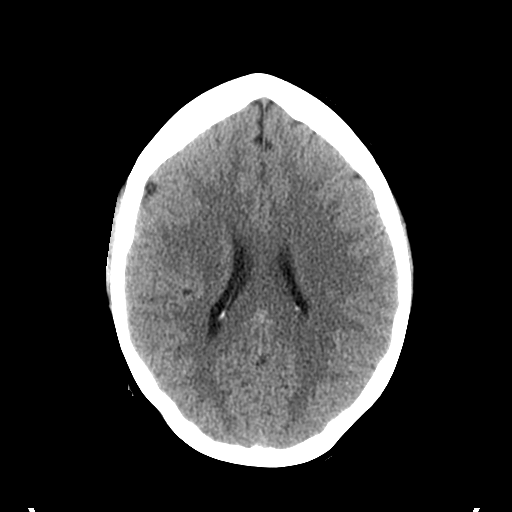
[im 16/30  bone]
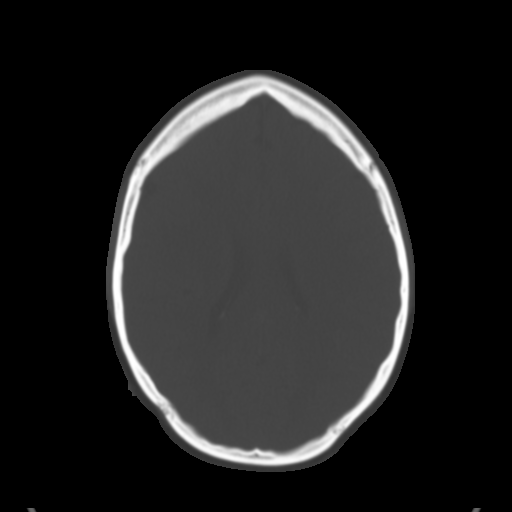
[im 19/30  brain]
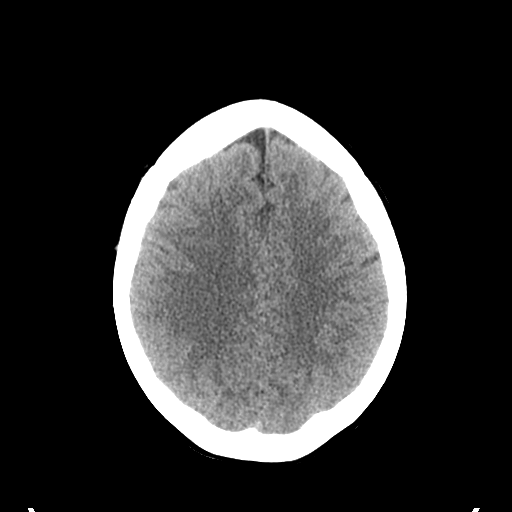
[im 22/30  brain]
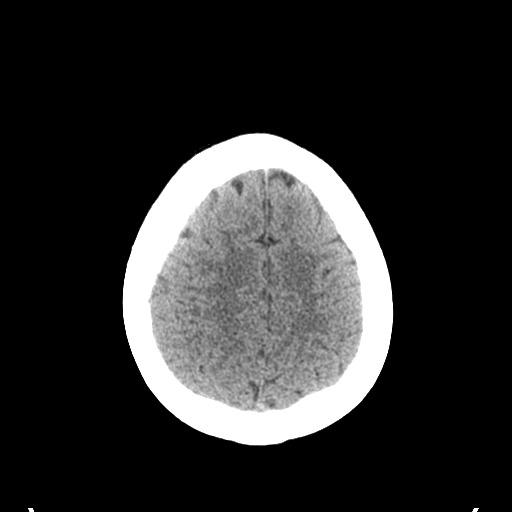
[im 25/30  brain]
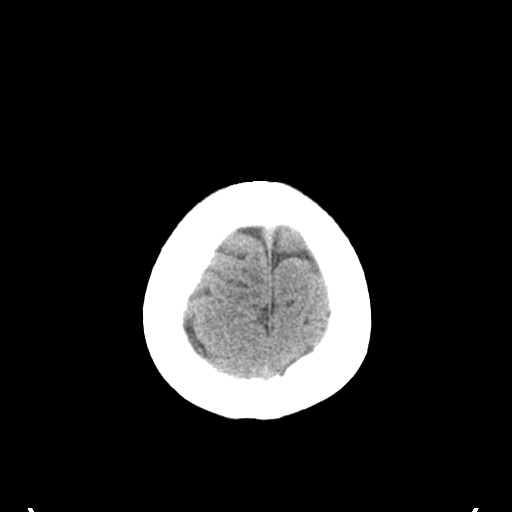
[im 28/30  brain]
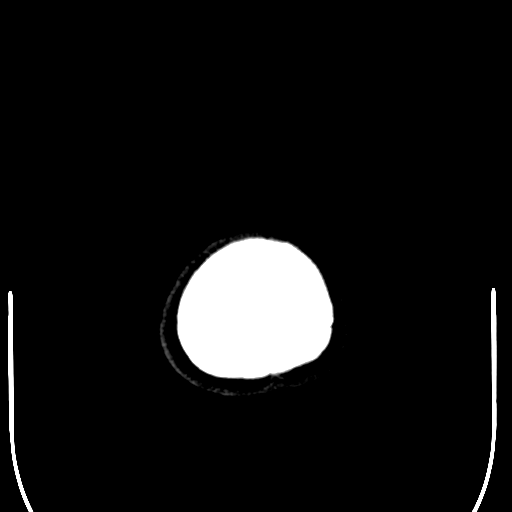
[im 28/30  bone]
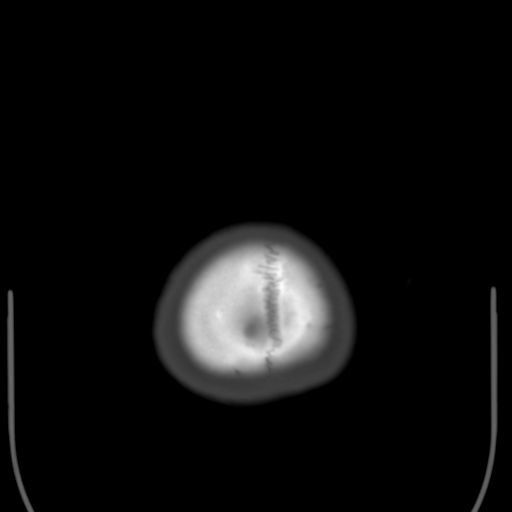

[Series 4: coronal soft tissue · coronal · 0.30mm/px · 3 of 66 slices shown]
[im 22/66  brain]
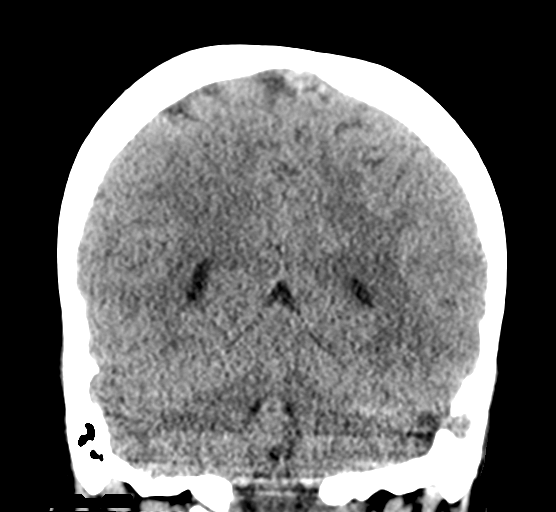
[im 29/66  brain]
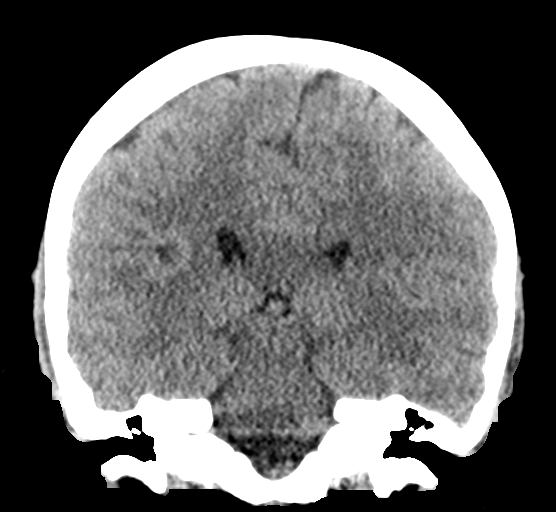
[im 37/66  brain]
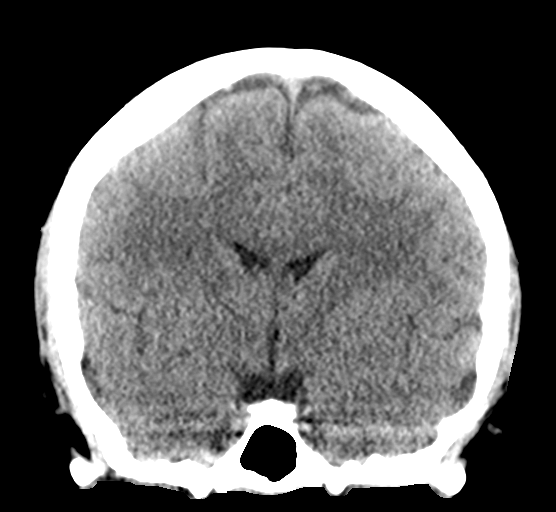

[Series 5: sagittal soft tissue · sagittal · 0.30mm/px · 3 of 51 slices shown]
[im 17/51  brain]
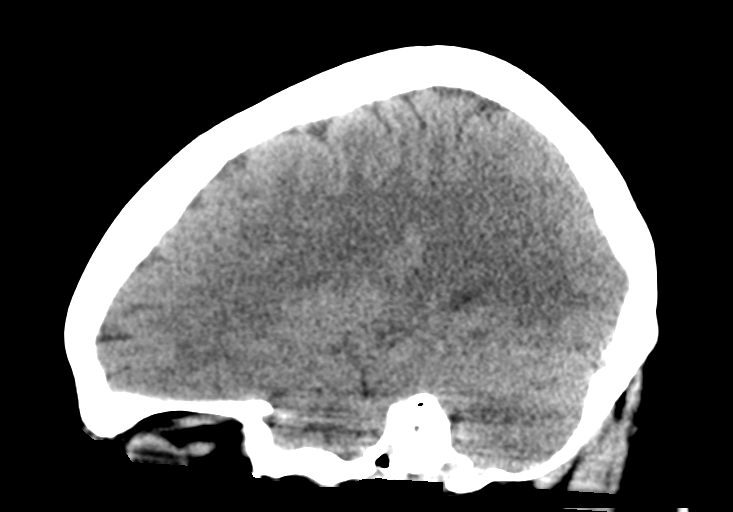
[im 26/51  brain]
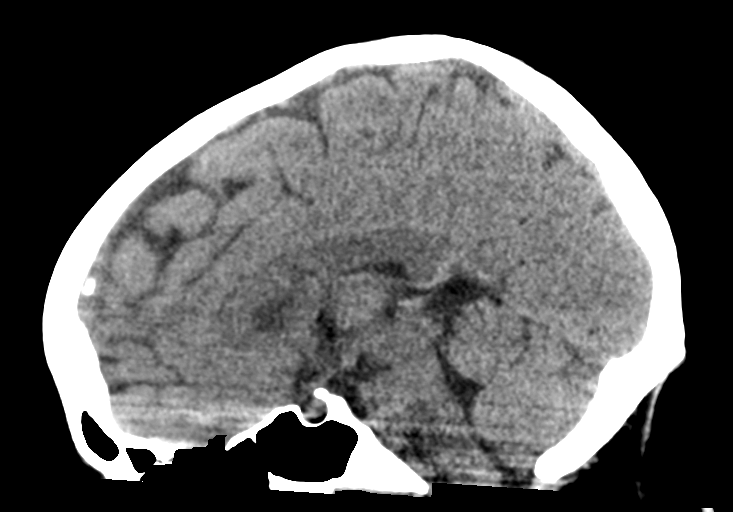
[im 34/51  brain]
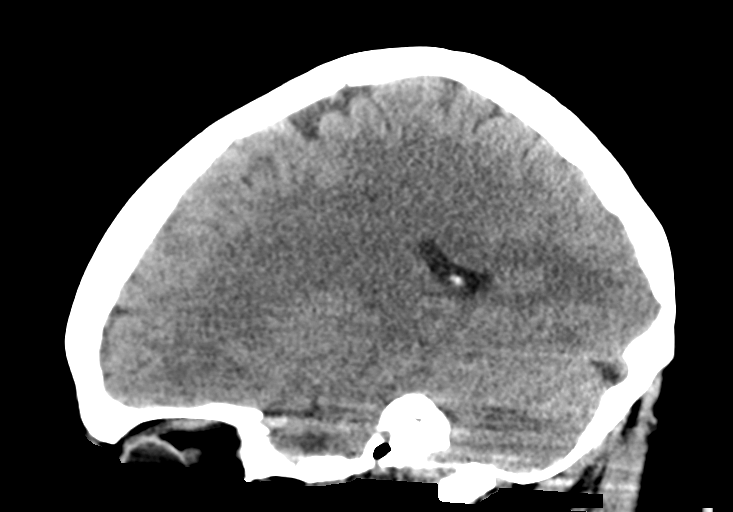

[15 of 47 positions shown; findings below may reference images not displayed]

FINDINGS: Brain: No evidence of acute infarction, hemorrhage, hydrocephalus,
extra-axial collection or mass lesion/mass effect.

Vascular: No hyperdense vessel or unexpected calcification.

Skull: Normal. Negative for fracture or focal lesion.

Sinuses/Orbits: No acute finding.

Other: None.
IMPRESSION: Normal head CT.

## 2019-10-11 ENCOUNTER — Other Ambulatory Visit (HOSPITAL_COMMUNITY)
Admission: RE | Admit: 2019-10-11 | Discharge: 2019-10-11 | Disposition: A | Discharge status: Home / Self Care | Payer: BC Managed Care – PPO | Source: Ambulatory Visit | Attending: Family Medicine | Admitting: Family Medicine

## 2019-10-11 ENCOUNTER — Emergency Department (INDEPENDENT_AMBULATORY_CARE_PROVIDER_SITE_OTHER)
Admission: EM | Admit: 2019-10-11 | Discharge: 2019-10-11 | Disposition: A | Discharge status: Home / Self Care | Payer: BC Managed Care – PPO | Source: Home / Self Care | Attending: Family Medicine | Admitting: Family Medicine

## 2019-10-11 DIAGNOSIS — N760 Acute vaginitis: Secondary | ICD-10-CM

## 2019-10-11 DIAGNOSIS — B9689 Other specified bacterial agents as the cause of diseases classified elsewhere: Secondary | ICD-10-CM | POA: Insufficient documentation

## 2019-10-11 DIAGNOSIS — B373 Candidiasis of vulva and vagina: Secondary | ICD-10-CM | POA: Insufficient documentation
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
## 2019-10-11 NOTE — ED Triage Notes (Signed)
Went to a water park several days ago, and now is having vaginal itching.

## 2019-10-12 LAB — CERVICOVAGINAL ANCILLARY ONLY
Bacterial Vaginitis (gardnerella): POSITIVE — AB
Candida Glabrata: NEGATIVE
Candida Vaginitis: POSITIVE — AB
Comment: NEGATIVE
Comment: NEGATIVE
Comment: NEGATIVE

## 2019-10-13 ENCOUNTER — Telehealth (HOSPITAL_COMMUNITY): Payer: Self-pay | Admitting: Orthopedic Surgery

## 2019-10-13 MED ORDER — METRONIDAZOLE 500 MG PO TABS: 500.0000 mg | ORAL_TABLET | Freq: Two times a day (BID) | ORAL | 0 refills | Status: DC

## 2019-10-13 MED ORDER — FLUCONAZOLE 150 MG PO TABS: 150.0000 mg | ORAL_TABLET | Freq: Every day | ORAL | 0 refills | Status: AC

## 2020-03-15 ENCOUNTER — Encounter: Payer: BC Managed Care – PPO | Admitting: Certified Nurse Midwife

## 2021-03-09 ENCOUNTER — Other Ambulatory Visit: Payer: Self-pay

## 2021-03-09 ENCOUNTER — Emergency Department (INDEPENDENT_AMBULATORY_CARE_PROVIDER_SITE_OTHER)
Admission: EM | Admit: 2021-03-09 | Discharge: 2021-03-09 | Disposition: A | Discharge status: Home / Self Care | Payer: BC Managed Care – PPO | Source: Home / Self Care | Attending: Family Medicine | Admitting: Family Medicine

## 2021-03-09 DIAGNOSIS — J029 Acute pharyngitis, unspecified: Secondary | ICD-10-CM | POA: Diagnosis not present

## 2021-03-09 LAB — POCT RAPID STREP A (OFFICE): Rapid Strep A Screen: NEGATIVE

## 2021-03-09 MED ORDER — AMOXICILLIN 875 MG PO TABS: 875.0000 mg | ORAL_TABLET | Freq: Two times a day (BID) | ORAL | 0 refills | Status: AC

## 2021-03-09 MED ORDER — PREDNISONE 20 MG PO TABS: 20.0000 mg | ORAL_TABLET | Freq: Two times a day (BID) | ORAL | 0 refills | Status: DC

## 2021-03-09 NOTE — Discharge Instructions (Signed)
Drink lots of fluids Run a humidifier or vaporizer if you have 1 Salt water gargles, sipping warm tea with honey, Tylenol or ibuprofen for sore throat pain Take the antibiotic 2 times a day Take prednisone 2 pills a day.  Take 2 pills today See your doctor if not improving by the end of the week

## 2021-03-09 NOTE — ED Triage Notes (Signed)
Pt present bilateral ear pain with sore throat and sharp pain on the left side of abdomen. Symptoms started five days ago. Pt tried otc medication with no relief. She was recently giving a antibiotic with no relief.
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
## 2021-06-19 DIAGNOSIS — F332 Major depressive disorder, recurrent severe without psychotic features: Secondary | ICD-10-CM | POA: Insufficient documentation

## 2022-02-24 ENCOUNTER — Other Ambulatory Visit: Payer: Self-pay

## 2022-02-24 ENCOUNTER — Ambulatory Visit
Admission: EM | Admit: 2022-02-24 | Discharge: 2022-02-24 | Disposition: A | Discharge status: Home / Self Care | Payer: BC Managed Care – PPO | Attending: Family Medicine | Admitting: Family Medicine

## 2022-02-24 ENCOUNTER — Encounter: Payer: Self-pay | Admitting: Emergency Medicine

## 2022-02-24 DIAGNOSIS — R21 Rash and other nonspecific skin eruption: Secondary | ICD-10-CM

## 2022-02-24 MED ORDER — PREDNISONE 10 MG (21) PO TBPK: ORAL_TABLET | Freq: Every day | ORAL | 0 refills | Status: DC

## 2022-02-24 MED ORDER — CEPHALEXIN 500 MG PO CAPS: 1000.0000 mg | ORAL_CAPSULE | Freq: Two times a day (BID) | ORAL | 0 refills | Status: AC

## 2022-02-24 NOTE — Discharge Instructions (Addendum)
Advised patient to take medications as directed with food to completion.  Encouraged patient to increase daily water intake to 64 ounces per day while taking these medications.  Advised patient not to use any topical ointments or creams on affected areas of skin for the next 10 days.  Advised if symptoms worsen and/or unresolved please follow-up with PCP or here for further evaluation.

## 2022-02-24 NOTE — ED Triage Notes (Signed)
Rash on face started yesterday on abdomin in groin. Came into contact with niece who had Scarlet fever last week. Hemorrhoids.
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
## 2023-11-24 ENCOUNTER — Other Ambulatory Visit (INDEPENDENT_AMBULATORY_CARE_PROVIDER_SITE_OTHER): Payer: Self-pay

## 2023-11-24 ENCOUNTER — Ambulatory Visit: Admitting: *Deleted

## 2023-11-24 VITALS — BP 132/83 | HR 65 | Wt 141.6 lb

## 2023-11-24 DIAGNOSIS — Z3480 Encounter for supervision of other normal pregnancy, unspecified trimester: Secondary | ICD-10-CM | POA: Insufficient documentation

## 2023-11-24 DIAGNOSIS — Z3401 Encounter for supervision of normal first pregnancy, first trimester: Secondary | ICD-10-CM | POA: Diagnosis not present

## 2023-11-24 DIAGNOSIS — Z3A11 11 weeks gestation of pregnancy: Secondary | ICD-10-CM | POA: Diagnosis not present

## 2023-11-24 DIAGNOSIS — O3680X0 Pregnancy with inconclusive fetal viability, not applicable or unspecified: Secondary | ICD-10-CM

## 2023-11-24 MED ORDER — BLOOD PRESSURE KIT DEVI: 1.0000 | 0 refills | Status: AC

## 2023-11-24 MED ORDER — VITAFOL GUMMIES 3.33-0.333-34.8 MG PO CHEW: 3.0000 | CHEWABLE_TABLET | Freq: Every day | ORAL | 11 refills | Status: DC

## 2023-11-24 NOTE — Patient Instructions (Signed)
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
## 2023-11-25 ENCOUNTER — Ambulatory Visit: Payer: Self-pay | Admitting: Obstetrics and Gynecology

## 2023-11-25 DIAGNOSIS — O26899 Other specified pregnancy related conditions, unspecified trimester: Secondary | ICD-10-CM | POA: Insufficient documentation

## 2023-11-25 DIAGNOSIS — Z3480 Encounter for supervision of other normal pregnancy, unspecified trimester: Secondary | ICD-10-CM

## 2023-11-25 LAB — CBC/D/PLT+RPR+RH+ABO+RUBIGG...
Antibody Screen: NEGATIVE
Basophils Absolute: 0 x10E3/uL (ref 0.0–0.2)
Basos: 0 %
EOS (ABSOLUTE): 0.2 x10E3/uL (ref 0.0–0.4)
Eos: 2 %
HCV Ab: NONREACTIVE
HIV Screen 4th Generation wRfx: NONREACTIVE
Hematocrit: 40.3 % (ref 34.0–46.6)
Hemoglobin: 13.2 g/dL (ref 11.1–15.9)
Hepatitis B Surface Ag: NEGATIVE
Immature Grans (Abs): 0 x10E3/uL (ref 0.0–0.1)
Immature Granulocytes: 0 %
Lymphocytes Absolute: 2.1 x10E3/uL (ref 0.7–3.1)
Lymphs: 16 %
MCH: 30 pg (ref 26.6–33.0)
MCHC: 32.8 g/dL (ref 31.5–35.7)
MCV: 92 fL (ref 79–97)
Monocytes Absolute: 0.6 x10E3/uL (ref 0.1–0.9)
Monocytes: 4 %
Neutrophils Absolute: 10.2 x10E3/uL — ABNORMAL HIGH (ref 1.4–7.0)
Neutrophils: 78 %
Platelets: 258 x10E3/uL (ref 150–450)
RBC: 4.4 x10E6/uL (ref 3.77–5.28)
RDW: 13 % (ref 11.7–15.4)
RPR Ser Ql: NONREACTIVE
Rh Factor: NEGATIVE
Rubella Antibodies, IGG: 3.48 {index} (ref >0.99)
WBC: 13.2 x10E3/uL — ABNORMAL HIGH (ref 3.4–10.8)

## 2023-11-25 LAB — COMPREHENSIVE METABOLIC PANEL WITH GFR
ALT: 16 IU/L (ref 0–32)
AST: 17 IU/L (ref 0–40)
Albumin: 4 g/dL (ref 4.0–5.0)
Alkaline Phosphatase: 62 IU/L (ref 44–121)
BUN/Creatinine Ratio: 15 (ref 9–23)
BUN: 7 mg/dL (ref 6–20)
Bilirubin Total: 0.4 mg/dL (ref 0.0–1.2)
CO2: 17 mmol/L — ABNORMAL LOW (ref 20–29)
Calcium: 9.4 mg/dL (ref 8.7–10.2)
Chloride: 104 mmol/L (ref 96–106)
Creatinine, Ser: 0.48 mg/dL — ABNORMAL LOW (ref 0.57–1.00)
Globulin, Total: 2.7 g/dL (ref 1.5–4.5)
Glucose: 74 mg/dL (ref 70–99)
Potassium: 4.1 mmol/L (ref 3.5–5.2)
Sodium: 137 mmol/L (ref 134–144)
Total Protein: 6.7 g/dL (ref 6.0–8.5)
eGFR: 133 mL/min/1.73 (ref >59)

## 2023-11-25 LAB — HCV INTERPRETATION

## 2023-11-26 LAB — CULTURE, OB URINE

## 2023-11-26 LAB — URINE CULTURE, OB REFLEX

## 2023-11-30 LAB — PANORAMA PRENATAL TEST FULL PANEL:PANORAMA TEST PLUS 5 ADDITIONAL MICRODELETIONS: FETAL FRACTION: 5.4

## 2023-12-02 LAB — HORIZON CUSTOM: REPORT SUMMARY: NEGATIVE

## 2023-12-08 ENCOUNTER — Encounter: Payer: Self-pay | Admitting: Obstetrics and Gynecology

## 2023-12-08 ENCOUNTER — Other Ambulatory Visit (HOSPITAL_COMMUNITY)
Admission: RE | Admit: 2023-12-08 | Discharge: 2023-12-08 | Disposition: A | Discharge status: Home / Self Care | Source: Ambulatory Visit | Attending: Obstetrics and Gynecology | Admitting: Obstetrics and Gynecology

## 2023-12-08 ENCOUNTER — Ambulatory Visit (INDEPENDENT_AMBULATORY_CARE_PROVIDER_SITE_OTHER): Admitting: Obstetrics and Gynecology

## 2023-12-08 VITALS — BP 133/84 | HR 72 | Wt 194.8 lb

## 2023-12-08 DIAGNOSIS — O26899 Other specified pregnancy related conditions, unspecified trimester: Secondary | ICD-10-CM

## 2023-12-08 DIAGNOSIS — O26891 Other specified pregnancy related conditions, first trimester: Secondary | ICD-10-CM | POA: Diagnosis not present

## 2023-12-08 DIAGNOSIS — Z6791 Unspecified blood type, Rh negative: Secondary | ICD-10-CM | POA: Diagnosis not present

## 2023-12-08 DIAGNOSIS — Z3A13 13 weeks gestation of pregnancy: Secondary | ICD-10-CM | POA: Diagnosis not present

## 2023-12-08 DIAGNOSIS — Z3480 Encounter for supervision of other normal pregnancy, unspecified trimester: Secondary | ICD-10-CM | POA: Insufficient documentation

## 2023-12-08 NOTE — Progress Notes (Signed)
 Pt presents for New OB. No questions or concerns. Will pick up BP cuff today. PHQ-9 score 5; GAD-7 score 4. Referral placed.

## 2023-12-08 NOTE — Patient Instructions (Signed)
The nature of [facility] with multiple MDs and other Advanced Practice Providers was explained to patient; also emphasized that residents, students are part of our team. 

## 2023-12-08 NOTE — Progress Notes (Signed)
 INITIAL PRENATAL VISIT NOTE  Subjective:  Connie Chapman is a 28 y.o. G1P0000 at [redacted]w[redacted]d by LMP being seen today for her initial prenatal visit. She has an uncomplicated medical history .  Patient reports no complaints.  Contractions: Not present. Vag. Bleeding: None.  Movement: Absent. Denies leaking of fluid.    Past Medical History:  Diagnosis Date   ADD (attention deficit disorder with hyperactivity)    Endometriosis    Seasonal allergies    Wears glasses    Goes to Dr. Geronimo Slack   Wears glasses     Past Surgical History:  Procedure Laterality Date   ADENOIDECTOMY     TONSILLECTOMY  2007   TYMPANOSTOMY TUBE PLACEMENT  2000, 2004   TYMPANOSTOMY TUBE PLACEMENT      OB History  Gravida Para Term Preterm AB Living  1 0 0 0 0 0  SAB IAB Ectopic Multiple Live Births  0 0 0 0 0    # Outcome Date GA Lbr Len/2nd Weight Sex Type Anes PTL Lv  1 Current             Social History   Socioeconomic History   Marital status: Single    Spouse name: Not on file   Number of children: Not on file   Years of education: Not on file   Highest education level: Not on file  Occupational History   Not on file  Tobacco Use   Smoking status: Never    Passive exposure: Yes   Smokeless tobacco: Never  Vaping Use   Vaping status: Former   Start date: 10/10/2017   Substances: Nicotine, Flavoring  Substance and Sexual Activity   Alcohol use: Not Currently    Comment: occ   Drug use: Never   Sexual activity: Yes    Birth control/protection: None    Comment: [employer], AB honor roll, Likes TV, has a dog, has stay at home mom, exposed to second hand smoke.  Other Topics Concern   Not on file  Social History Narrative   She is in school at [employer]. She does like her teachers. She is usually on the AB honor role. She does have a dog. Her mom Crystal is a stay-at-home mom and her father Thresa Larve works for [employer]: Not on [employer] Insecurity: Not on file  Transportation Needs: Not on file  Physical Activity: Not on file  Stress: Not on file  Social Connections: Unknown (08/21/2021)   Received from [employer]   Social Network    Social Network: Not on file    Family History  Problem Relation Age of Onset   Hearing loss Mother    Scoliosis Mother    Endometriosis Mother    Hypertension Mother    Other Mother        Tumor Cerebri   Hyperlipidemia Father    Sleep apnea Father    Hypertension Other    Thyroid disease Other    Heart attack Other      Current Outpatient Medications:    acetaminophen  (TYLENOL ) 325 MG tablet, Take 650 mg by mouth every 6 (six) hours as needed for headache., Disp: , Rfl:    Prenatal Vit-Fe Fumarate-FA (PRENATAL VITAMIN PO), Take 1 tablet by mouth daily., Disp: , Rfl:    Blood Pressure Monitoring (BLOOD PRESSURE KIT) DEVI, 1 Device by Does not apply route once a week. (Patient  not taking: Reported on 12/08/2023), Disp: 1 each, Rfl: 0   predniSONE  (STERAPRED UNI-PAK 21 TAB) 10 MG (21) TBPK tablet, Take by mouth daily. Take 6 tabs by mouth daily  for 2 days, then 5 tabs for 2 days, then 4 tabs for 2 days, then 3 tabs for 2 days, 2 tabs for 2 days, then 1 tab by mouth daily for 2 days (Patient not taking: Reported on 12/08/2023), Disp: 42 tablet, Rfl: 0  Allergies  Allergen Reactions   Codeine  Nausea And Vomiting    Review of Systems: Negative except for what is mentioned in HPI.  Objective:   Vitals:   12/08/23 1018  BP: 133/84  Pulse: 72  Weight: 194 lb 12.8 oz (88.4 kg)    Fetal Status: Fetal Heart Rate (bpm): 159   Movement: Absent     Physical Exam: BP 133/84   Pulse 72   Wt 194 lb 12.8 oz (88.4 kg)   LMP 09/08/2023 (Approximate)   BMI 32.42 kg/m  CONSTITUTIONAL: Well-developed, well-nourished female in no acute distress.  NEUROLOGIC: Alert and oriented to person, place, and time. Normal reflexes, muscle tone  coordination. No cranial nerve deficit noted. PSYCHIATRIC: Normal mood and affect. Normal behavior. Normal judgment and thought content. SKIN: Skin is warm and dry. No rash noted. Not diaphoretic. No erythema. No pallor. HENT:  Normocephalic, atraumatic, External right and left ear normal. Oropharynx is clear and moist EYES: Conjunctivae and EOM are normal.  NECK: Normal range of motion, supple, no masses CARDIOVASCULAR: Normal heart rate noted, regular rhythm RESPIRATORY: Effort and breath sounds normal, no problems with respiration noted BREASTS: deferred ABDOMEN: Soft, nontender, nondistended, gravid. GU: normal appearing external female genitalia, nulliparous normal appearing cervix, pap taken without incident,  scant white discharge in vagina, no lesions noted Bimanual: 13 weeks sized uterus, no adnexal tenderness or palpable lesions noted MUSCULOSKELETAL: Normal range of motion. EXT:  No edema and no tenderness. 2+ distal pulses.   Assessment and Plan:  Pregnancy: G1P0000 at [redacted]w[redacted]d by LMP  1. Supervision of other normal pregnancy, antepartum (Primary) Continue routine prenatal care  - Cytology - PAP( [facility]) - Cervicovaginal ancillary only( ) - HgB A1c  2. [redacted] weeks gestation of pregnancy   3. Rh negative state in antepartum period Rhogam at scheduled intervals   Preterm labor symptoms and general obstetric precautions including but not limited to vaginal bleeding, contractions, leaking of fluid and fetal movement were reviewed in detail with the patient.  Please refer to After Visit Summary for other counseling recommendations.   Return in about 4 weeks (around 01/05/2024) for ROB, in person.  Jerilynn DELENA Buddle 12/08/2023 11:22 AM

## 2023-12-09 ENCOUNTER — Ambulatory Visit: Payer: Self-pay | Admitting: Obstetrics and Gynecology

## 2023-12-09 DIAGNOSIS — B3731 Acute candidiasis of vulva and vagina: Secondary | ICD-10-CM

## 2023-12-09 LAB — CERVICOVAGINAL ANCILLARY ONLY
Bacterial Vaginitis (gardnerella): NEGATIVE
Candida Glabrata: NEGATIVE
Candida Vaginitis: POSITIVE — AB
Chlamydia: NEGATIVE
Comment: NEGATIVE
Comment: NEGATIVE
Comment: NEGATIVE
Comment: NEGATIVE
Comment: NEGATIVE
Comment: NORMAL
Neisseria Gonorrhea: NEGATIVE
Trichomonas: NEGATIVE

## 2023-12-09 LAB — HEMOGLOBIN A1C
Est. average glucose Bld gHb Est-mCnc: 103 mg/dL
Hgb A1c MFr Bld: 5.2 % (ref 4.8–5.6)

## 2023-12-09 LAB — CYTOLOGY - PAP
Adequacy: ABSENT
Diagnosis: NEGATIVE

## 2023-12-10 MED ORDER — MICONAZOLE NITRATE 2 % VA CREA: 1.0000 | TOPICAL_CREAM | Freq: Every day | VAGINAL | 0 refills | Status: AC

## 2024-01-05 ENCOUNTER — Ambulatory Visit: Admitting: Physician Assistant

## 2024-01-05 ENCOUNTER — Encounter (HOSPITAL_BASED_OUTPATIENT_CLINIC_OR_DEPARTMENT_OTHER): Payer: Self-pay | Admitting: Emergency Medicine

## 2024-01-05 ENCOUNTER — Encounter: Payer: Self-pay | Admitting: Physician Assistant

## 2024-01-05 ENCOUNTER — Emergency Department (HOSPITAL_BASED_OUTPATIENT_CLINIC_OR_DEPARTMENT_OTHER)
Admission: EM | Admit: 2024-01-05 | Discharge: 2024-01-05 | Disposition: A | Discharge status: Home / Self Care | Attending: Emergency Medicine | Admitting: Emergency Medicine

## 2024-01-05 ENCOUNTER — Other Ambulatory Visit: Payer: Self-pay

## 2024-01-05 VITALS — BP 130/78 | HR 71 | Wt 200.8 lb

## 2024-01-05 DIAGNOSIS — Z3480 Encounter for supervision of other normal pregnancy, unspecified trimester: Secondary | ICD-10-CM

## 2024-01-05 DIAGNOSIS — K0889 Other specified disorders of teeth and supporting structures: Secondary | ICD-10-CM | POA: Diagnosis present

## 2024-01-05 DIAGNOSIS — K029 Dental caries, unspecified: Secondary | ICD-10-CM | POA: Diagnosis not present

## 2024-01-05 DIAGNOSIS — Z3A17 17 weeks gestation of pregnancy: Secondary | ICD-10-CM

## 2024-01-05 MED ORDER — CHLORHEXIDINE GLUCONATE 0.12 % MT SOLN: 15.0000 mL | Freq: Two times a day (BID) | OROMUCOSAL | 0 refills | Status: DC

## 2024-01-05 MED ORDER — PENICILLIN V POTASSIUM 500 MG PO TABS
500.0000 mg | ORAL_TABLET | Freq: Four times a day (QID) | ORAL | 0 refills | Status: DC
Start: 2024-01-05 — End: 2024-01-05

## 2024-01-05 MED ORDER — PENICILLIN V POTASSIUM 500 MG PO TABS: 500.0000 mg | ORAL_TABLET | Freq: Four times a day (QID) | ORAL | 0 refills | Status: AC
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
## 2024-01-05 NOTE — Progress Notes (Signed)
 Pt presents for ROB visit. Pt c/o dental pain, taking a lot of Tylenol  for this. She has an dentist appt 01-27-24. Wants antibiotic today if possible

## 2024-01-05 NOTE — Discharge Instructions (Addendum)
 As discussed, we will place you on antibiotics to treat infection.  Recommend follow-up with dental specialist to have affected tooth addressed.  You may continue take Tylenol  for pain

## 2024-01-05 NOTE — ED Provider Notes (Signed)
 [place] [facility] AT [facility] Provider Note   CSN: [id number] Arrival date & time: 01/05/24  8251     Patient presents with: Dental Pain   Connie Chapman is a 28 y.o. female.    Dental Pain   28 year old female presents emergency department complaints of right lower dental pain.  Has known cavity in the area.  States that has been going on for the past several days nearing a week.  States that she typically can do salt water rinses, Tylenol  and it improves but symptoms have continued.  States that she is around [redacted] weeks pregnant.  Denies any difficulty breathing, feelings of throat closing on her, fever, chills.  Presents due to concern for possible infection.  Past medical history significant for ADD, seasonal allergies  Prior to Admission medications   Medication Sig Start Date End Date Taking? Authorizing Provider  chlorhexidine  (PERIDEX ) 0.12 % solution Use as directed 15 mLs in the mouth or throat 2 (two) times daily. 01/05/24  Yes Silver Fell A, PA  penicillin  v potassium (VEETID) 500 MG tablet Take 1 tablet (500 mg total) by mouth 4 (four) times daily for 10 days. 01/05/24 01/15/24 Yes Silver Fell A, PA  acetaminophen  (TYLENOL ) 325 MG tablet Take 650 mg by mouth every 6 (six) hours as needed for headache.    [provider]  Blood Pressure Monitoring (BLOOD PRESSURE KIT) DEVI 1 Device by Does not apply route once a week. Patient not taking: Reported on 01/05/2024 11/24/23   Zina Jerilynn DELENA, MD  predniSONE  (STERAPRED UNI-PAK 21 TAB) 10 MG (21) TBPK tablet Take by mouth daily. Take 6 tabs by mouth daily  for 2 days, then 5 tabs for 2 days, then 4 tabs for 2 days, then 3 tabs for 2 days, 2 tabs for 2 days, then 1 tab by mouth daily for 2 days Patient not taking: Reported on 01/05/2024 02/24/22   Teddy Sharper, FNP  Prenatal Vit-Fe Fumarate-FA (PRENATAL VITAMIN PO) Take 1 tablet by mouth daily.    [provider]    Allergies:  Codeine     Review of Systems  All other systems reviewed and are negative.   Updated Vital Signs BP (!) 150/85 (BP Location: Right Arm)   Pulse 94   Temp 98.2 F (36.8 C) (Oral)   Resp 18   Ht 5' 5 (1.651 m)   Wt 91.1 kg   LMP 09/08/2023 (Approximate)   SpO2 99%   BMI 33.41 kg/m   Physical Exam Vitals and nursing note reviewed.  Constitutional:      General: She is not in acute distress.    Appearance: She is well-developed.  HENT:     Head: Normocephalic and atraumatic.     Mouth/Throat:      Comments: Tenderness along gingiva as above.  Numerous dental caries present.  No evidence clinically of fluctuance on gingiva.  No sublingual or submandibular swelling.  No trismus.  Uvula midline rises symmetrical phonation.  Patient tolerating oral secretions without difficulty. Eyes:     Conjunctiva/sclera: Conjunctivae normal.  Cardiovascular:     Rate and Rhythm: Normal rate and regular rhythm.     Heart sounds: No murmur heard. Pulmonary:     Effort: Pulmonary effort is normal. No respiratory distress.     Breath sounds: Normal breath sounds.  Abdominal:     Palpations: Abdomen is soft.     Tenderness: There is no abdominal tenderness.  Musculoskeletal:  General: No swelling.     Cervical back: Neck supple.  Skin:    General: Skin is warm and dry.     Capillary Refill: Capillary refill takes less than 2 seconds.  Neurological:     Mental Status: She is alert.  Psychiatric:        Mood and Affect: Mood normal.     (all labs ordered are listed, but only abnormal results are displayed) Labs Reviewed - No data to display  EKG: None  Radiology: No results found.   Procedures   Medications Ordered in the ED - No data to display                                  Medical Decision Making Risk Prescription drug management.   This patient presents to the ED for concern of dental pain, this involves an extensive number of treatment options, and is a  complaint that carries with it a high risk of complications and morbidity.  The differential diagnosis includes cellulitis, periapical abscess, Ludwig angina, necrotizing ulcerative gingivitis, other   Co morbidities that complicate the patient evaluation  See HPI   Additional history obtained:  Additional history obtained from EMR External records from outside source obtained and reviewed including hospital records   Lab Tests:  N/a   Imaging Studies ordered:  N/a   Cardiac Monitoring: / EKG:  N/a   Consultations Obtained:  N/a   Problem List / ED Course / Critical interventions / Medication management  Dental pain Reevaluation of the patient showed that the patient stayed the same I have reviewed the patients home medicines and have made adjustments as needed   Social Determinants of Health:  Denies tobacco, licit drug use.   Test / Admission - Considered:  Dental pain Vitals signs significant for hypertension blood pressure 150/85. Otherwise within normal range and stable throughout visit.  28 year old female presents emergency department complaints of right lower dental pain.  Has known cavity in the area.  States that has been going on for the past several days nearing a week.  States that she typically can do salt water rinses, Tylenol  and it improves but symptoms have continued.  States that she is around [redacted] weeks pregnant.  Denies any difficulty breathing, feelings of throat closing on her, fever, chills.  Presents due to concern for possible infection. On exam, tenderness along gingiva as above.  Numerous dental caries present.  No clinical evidence of periapical abscess, Ludwig angina, necrotizing ulcerative gingivitis.  Will place patient empirically on antibiotics safe and pregnancy.  Will recommend additional oral hygiene as an AVS and follow-up with dental specialist to have affected teeth addressed.  Treatment plan discussed with patient she  acknowledged understanding was agreeable.  Patient well-appearing, afebrile in no acute distress upon discharge. Worrisome signs and symptoms were discussed with the patient, and the patient acknowledged understanding to return to the ED if noticed. Patient was stable upon discharge.       Final diagnoses:  Pain, dental    ED Discharge Orders          Ordered    penicillin  v potassium (VEETID) 500 MG tablet  4 times daily        01/05/24 2041    chlorhexidine  (PERIDEX ) 0.12 % solution  2 times daily        01/05/24 2041  Silver Wonda LABOR, [place] 01/05/24 2059    Lenor Hollering, MD 01/05/24 [phone]

## 2024-01-05 NOTE — ED Triage Notes (Signed)
 Pt c/o dental pain (RT lower) x 1 wk; has a dental appt Oct 16

## 2024-01-07 LAB — AFP, SERUM, OPEN SPINA BIFIDA
AFP MoM: 0.79
AFP Value: 25.2 ng/mL
Gest. Age on Collection Date: 17 wk
Maternal Age At EDD: 27.6 a
OSBR Risk 1 IN: 10000
Test Results:: NEGATIVE
Weight: 200 [lb_av]

## 2024-01-11 ENCOUNTER — Ambulatory Visit: Payer: Self-pay | Admitting: Physician Assistant

## 2024-01-28 ENCOUNTER — Telehealth: Admitting: Physician Assistant

## 2024-01-28 DIAGNOSIS — J208 Acute bronchitis due to other specified organisms: Secondary | ICD-10-CM

## 2024-01-28 MED ORDER — ALBUTEROL SULFATE HFA 108 (90 BASE) MCG/ACT IN AERS: 1.0000 | INHALATION_SPRAY | Freq: Four times a day (QID) | RESPIRATORY_TRACT | 0 refills | Status: DC | PRN
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
## 2024-01-28 NOTE — Patient Instructions (Signed)
 Connie Chapman, thank you for joining Delon CHRISTELLA Dickinson, PA-C for today's virtual visit.  While this provider is not your primary care provider (PCP), if your PCP is located in our provider database this encounter information will be shared with them immediately following your visit.   A [facility] MyChart account gives you access to today's visit and all your visits, tests, and labs performed at [facility]  click here if you don't have a [facility] MyChart account or go to [email or web address]  Consent: (Patient) Connie Chapman provided verbal consent for this virtual visit at the beginning of the encounter.  Current Medications:  Current Outpatient Medications:    albuterol  (VENTOLIN  HFA) 108 (90 Base) MCG/ACT inhaler, Inhale 1-2 puffs into the lungs every 6 (six) hours as needed., Disp: 8 g, Rfl: 0   acetaminophen  (TYLENOL ) 325 MG tablet, Take 650 mg by mouth every 6 (six) hours as needed for headache., Disp: , Rfl:    Blood Pressure Monitoring (BLOOD PRESSURE KIT) DEVI, 1 Device by Does not apply route once a week. (Patient not taking: Reported on 01/05/2024), Disp: 1 each, Rfl: 0   chlorhexidine  (PERIDEX ) 0.12 % solution, Use as directed 15 mLs in the mouth or throat 2 (two) times daily., Disp: 120 mL, Rfl: 0   predniSONE  (STERAPRED UNI-PAK 21 TAB) 10 MG (21) TBPK tablet, Take by mouth daily. Take 6 tabs by mouth daily  for 2 days, then 5 tabs for 2 days, then 4 tabs for 2 days, then 3 tabs for 2 days, 2 tabs for 2 days, then 1 tab by mouth daily for 2 days (Patient not taking: Reported on 01/05/2024), Disp: 42 tablet, Rfl: 0   Prenatal Vit-Fe Fumarate-FA (PRENATAL VITAMIN PO), Take 1 tablet by mouth daily., Disp: , Rfl:    Medications ordered in this encounter:  Meds ordered this encounter  Medications   albuterol  (VENTOLIN  HFA) 108 (90 Base) MCG/ACT inhaler    Sig: Inhale 1-2 puffs into the lungs every 6 (six) hours as needed.    Dispense:  8 g    Refill:  0     Supervising Provider:   BLAISE ALEENE KIDD [[id number]]     *If you need refills on other medications prior to your next appointment, please contact your pharmacy*  Follow-Up: Call back or seek an in-person evaluation if the symptoms worsen or if the condition fails to improve as anticipated.  [facility] [phone]  Other Instructions  Common Medications Safe in Pregnancy  Acne:      Constipation:  Benzoyl Peroxide     Colace  Clindamycin      Dulcolax Suppository  Topica Erythromycin     Fibercon  Salicylic Acid      Metamucil         Miralax AVOID:        Senakot   Accutane    Cough:  Retin-A       Cough Drops  Tetracycline      Phenergan  w/ Codeine  if Rx  Minocycline      Robitussin (Plain & DM)  Antibiotics:     Crabs/Lice:  Ceclor       RID  Cephalosporins    AVOID:  E-Mycins      Kwell  Keflex   Macrobid/Macrodantin   Diarrhea:  Penicillin       Kao-Pectate  Zithromax       Imodium  AD         PUSH FLUIDS AVOID:  Cipro     Fever:  Tetracycline      Tylenol  (Regular or Extra  Minocycline       Strength)  Levaquin      Extra Strength-Do not          Exceed 8 tabs/24 hrs Caffeine:        200mg /day (equiv. To 1 cup of coffee or  approx. 3 12 oz sodas)         Gas: Cold/Hayfever:       Gas-X  Benadryl       Mylicon  Claritin       Phazyme  **Claritin-D        Chlor-Trimeton    Headaches:  Dimetapp      ASA-Free Excedrin  Drixoral-Non-Drowsy     Cold Compress  Mucinex (Guaifenasin)     Tylenol  (Regular or Extra  Sudafed/Sudafed-12 Hour     Strength)  **Sudafed PE Pseudoephedrine   Tylenol  Cold & Sinus     Vicks Vapor Rub  Zyrtec  **AVOID if Problems With Blood Pressure         Heartburn: Avoid lying down for at least 1 hour after meals  Aciphex      Maalox     Rash:  Milk of Magnesia     Benadryl     Mylanta       1% Hydrocortisone Cream  Pepcid  Pepcid Complete   Sleep  Aids:  Prevacid      Ambien   Prilosec       Benadryl   Rolaids       Chamomile Tea  Tums (Limit 4/day)     Unisom         Tylenol  PM         Warm milk-add vanilla or  Hemorrhoids:       Sugar for taste  Anusol/Anusol H.C.  (RX: Analapram 2.5%)  Sugar Substitutes:  Hydrocortisone OTC     Ok in moderation  Preparation H      Tucks        Vaseline lotion applied to tissue with wiping    Herpes:     Throat:  Acyclovir      Oragel  Famvir  Valtrex     Vaccines:         Flu Shot Leg Cramps:       *Gardasil  Benadryl       Hepatitis A         Hepatitis B Nasal Spray:       Pneumovax  Saline Nasal Spray     Polio Booster         Tetanus Nausea:       Tuberculosis test or PPD  Vitamin B6 25 mg TID   AVOID:    Dramamine      *Gardasil  Emetrol       Live Poliovirus  Ginger Root 250 mg QID    MMR (measles, mumps &  High Complex Carbs @ Bedtime    rebella)  Sea Bands-Accupressure    Varicella (Chickenpox)  Unisom 1/2 tab TID     *No known complications           If received before Pain:         Known pregnancy;   Darvocet       Resume series after  Lortab        Delivery  Percocet    Yeast:   Tramadol       Femstat  Tylenol  3  Gyne-lotrimin  Ultram        Monistat   Vicodin           MISC:         All Sunscreens           Hair Coloring/highlights          Insect Repellant's          (Including DEET)         Mystic Tans    If you have been instructed to have an in-person evaluation today at a local Urgent Care facility, please use the link below. It will take you to a list of all of our available [facility], including address, phone number and hours of operation. Please do not delay care.  [facility]  If you or a family member do not have a primary care provider, use the link below to schedule a visit and establish care. When you choose a [facility] primary care physician or advanced practice provider, you gain a long-term partner in  health. Find a Primary Care Provider  Learn more about [facility] in-office and virtual care options:  - Get Care Now

## 2024-02-02 ENCOUNTER — Ambulatory Visit: Admitting: Physician Assistant

## 2024-02-02 VITALS — BP 131/88 | HR 77 | Wt 205.2 lb

## 2024-02-02 DIAGNOSIS — Z3480 Encounter for supervision of other normal pregnancy, unspecified trimester: Secondary | ICD-10-CM

## 2024-02-02 DIAGNOSIS — Z6791 Unspecified blood type, Rh negative: Secondary | ICD-10-CM

## 2024-02-02 DIAGNOSIS — J40 Bronchitis, not specified as acute or chronic: Secondary | ICD-10-CM

## 2024-02-02 DIAGNOSIS — F332 Major depressive disorder, recurrent severe without psychotic features: Secondary | ICD-10-CM

## 2024-02-02 DIAGNOSIS — Z3A21 21 weeks gestation of pregnancy: Secondary | ICD-10-CM

## 2024-02-02 NOTE — Progress Notes (Signed)
 Pt presents for rob. Pt is having trouble sleeping at night due to bronchitis. No other questions or concerns at this time.
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
## 2024-02-02 NOTE — Patient Instructions (Signed)
 Safe Over-The-Counter Medications in Pregnancy   Acne:  Benzoyl Peroxide  Salicylic Acid   Backache/Headache:  Tylenol : 2 regular strength every 4 hours OR               2 Extra strength every 6 hours   Colds/Coughs/Allergies:  Benadryl  (alcohol free) 25 mg every 6 hours as needed  Breath right strips  Claritin  Cepacol throat lozenges  Chloraseptic throat spray  Cold-Eeze- up to three times per day  Cough drops, alcohol free  Flonase (by prescription only)  Guaifenesin  Mucinex  Robitussin DM (plain only, alcohol free)  Saline nasal spray/drops  Sudafed (pseudoephedrine) & Actifed * use only after [redacted] weeks gestation and if you do not have high blood pressure  Tylenol   Vicks Vaporub  Zinc lozenges  Zyrtec   Constipation:  Colace  Ducolax suppositories  Fleet enema  Glycerin  suppositories  Metamucil  Milk of magnesia  Miralax  Senokot  Smooth move tea   Diarrhea:  Kaopectate  Imodium A-D   *NO pepto Bismol*  Hemorrhoids:  Anusol  Anusol HC  Preparation H  Tucks   Indigestion:  Tums  Maalox  Mylanta  Zantac  Pepcid   Insomnia:  Benadryl  (alcohol free) 25mg  every 6 hours as needed  Tylenol  PM  Unisom, no Gelcaps   Leg Cramps:  Tums  MagGel   Nausea/Vomiting:  Bonine  Dramamine  Emetrol  Ginger extract  Sea bands  Meclizine  Nausea medication to take during pregnancy:  Unisom (doxylamine succinate 25 mg tablets) Take one tablet daily at bedtime. If symptoms are not adequately controlled, the dose can be increased to a maximum recommended dose of two tablets daily (1/2 tablet in the morning, 1/2 tablet mid-afternoon and one at bedtime).  Vitamin B6 100mg  tablets. Take one tablet twice a day (up to 200 mg per day).   Skin Rashes:  Aveeno products  Benadryl  cream or 25mg  every 6 hours as needed  Calamine Lotion  1% cortisone cream   Yeast infection:  Gyne-lotrimin 7  Monistat 7   **If taking multiple medications, please check labels to  avoid duplicating the same active ingredients  **take medication as directed on the label  ** Do not exceed 4000 mg of tylenol  in 24 hours  **Do not take medications that contain aspirin or ibuprofen      While you are pregnant, there are some foods you should not eat or eat only in small amounts:  Certain types of cooked fish--While you're pregnant, do not eat bigeye tuna, king mackerel, marlin, orange roughy, shark, swordfish, or tilefish. Limit white (albacore) tuna to only 6 ounces a week. These fish may have high levels of mercury, which can be harmful during pregnancy. All other types of cooked fish are safe and healthy for you and your pregnancy. Try to eat at least two servings of fish or shellfish per week.  Caffeine --Caffeine  is found in coffee, tea, chocolate, energy drinks, and soft drinks. It's a good idea to limit your daily intake of caffeine  to less than 200 milligrams, which is the amount in one 12-ounce cup of coffee. Limiting caffeine  can help with nausea and sleep problems.  Sushi--Raw fish may be harmful during pregnancy. Cooked sushi is fine.  Unpasteurized milk and cheese--These foods can cause a disease called listeriosis. Avoid cheeses that are made with raw milk (such as some feta, queso fresco, and blue cheeses). Hot dogs and lunch meats can also cause this disease, although it's rare. To be on  the safe side, only eat hot dogs and lunch meats that have been heated until steaming hot.

## 2024-02-14 ENCOUNTER — Telehealth: Admitting: Physician Assistant

## 2024-02-14 DIAGNOSIS — R0789 Other chest pain: Secondary | ICD-10-CM | POA: Diagnosis not present
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
## 2024-02-14 NOTE — Patient Instructions (Signed)
  Connie Chapman, thank you for joining Harlene PEDLAR Ward, PA-C for today's virtual visit.  While this provider is not your primary care provider (PCP), if your PCP is located in our provider database this encounter information will be shared with them immediately following your visit.   A [facility] MyChart account gives you access to today's visit and all your visits, tests, and labs performed at [facility]  click here if you don't have a [facility] MyChart account or go to [email or web address]  Consent: (Patient) Connie Chapman provided verbal consent for this virtual visit at the beginning of the encounter.  Current Medications:  Current Outpatient Medications:    acetaminophen  (TYLENOL ) 325 MG tablet, Take 650 mg by mouth every 6 (six) hours as needed for headache., Disp: , Rfl:    albuterol  (VENTOLIN  HFA) 108 (90 Base) MCG/ACT inhaler, Inhale 1-2 puffs into the lungs every 6 (six) hours as needed., Disp: 8 g, Rfl: 0   Blood Pressure Monitoring (BLOOD PRESSURE KIT) DEVI, 1 Device by Does not apply route once a week. (Patient not taking: Reported on 02/02/2024), Disp: 1 each, Rfl: 0   chlorhexidine  (PERIDEX ) 0.12 % solution, Use as directed 15 mLs in the mouth or throat 2 (two) times daily., Disp: 120 mL, Rfl: 0   predniSONE  (STERAPRED UNI-PAK 21 TAB) 10 MG (21) TBPK tablet, Take by mouth daily. Take 6 tabs by mouth daily  for 2 days, then 5 tabs for 2 days, then 4 tabs for 2 days, then 3 tabs for 2 days, 2 tabs for 2 days, then 1 tab by mouth daily for 2 days (Patient not taking: Reported on 01/05/2024), Disp: 42 tablet, Rfl: 0   Prenatal Vit-Fe Fumarate-FA (PRENATAL VITAMIN PO), Take 1 tablet by mouth daily., Disp: , Rfl:    Medications ordered in this encounter:  No orders of the defined types were placed in this encounter.    *If you need refills on other medications prior to your next appointment, please contact your pharmacy*  Follow-Up: Call back or seek an  in-person evaluation if the symptoms worsen or if the condition fails to improve as anticipated.  [facility] [phone]  Other Instructions Follow up with OB if no improvement or symptoms become worse.    If you have been instructed to have an in-person evaluation today at a local Urgent Care facility, please use the link below. It will take you to a list of all of our available [facility], including address, phone number and hours of operation. Please do not delay care.  [facility]  If you or a family member do not have a primary care provider, use the link below to schedule a visit and establish care. When you choose a [facility] primary care physician or advanced practice provider, you gain a long-term partner in health. Find a Primary Care Provider  Learn more about [facility]'s in-office and virtual care options: [facility] - Get Care Now

## 2024-02-23 ENCOUNTER — Other Ambulatory Visit: Payer: Self-pay | Admitting: Physician Assistant

## 2024-02-23 DIAGNOSIS — Z3480 Encounter for supervision of other normal pregnancy, unspecified trimester: Secondary | ICD-10-CM

## 2024-02-23 NOTE — ED Provider Notes (Signed)
 [facility]  ED Provider Note  Connie Chapman 28 y.o. female DOB: [birth year] MRN: [id number] History   Chief Complaint  Patient presents with  . Shortness of Breath    Pt here for worsening shob x3 days. Pt reports being dx with bronchitis 4 weeks ago, has still been using albuterol  inhaler without relief. Pt reports shob and cough keep her up. Pt is approx [redacted] weeks pregnant   Tele-Medical screening initiated and orders placed by Donnice JAYSON Real, NP. 02/23/2024 / 8:47 PM  Patient seen and received a tele-medical screening examination in triage.  The provider performing the medical screening exam was not located at the facility and was located remotely.  Patient understands that the provider is seeing them remotely and consents to the exam.  Appropriate orders have been initiated based on my brief physical exam and HPI. Patient placed in appropriate area until a treatment room becomes available for further evaluation and management by the in-house provider.  This tele-medical screening exam was electronically signed by Donnice JAYSON Real, NP on 02/23/2024 at 8:47 PM  28 y.o. female who is [redacted] weeks pregnant who presents to the emergency department today with a continued complaint of shortness of breath, wheezing/crackling when she is attempting to sleep and has been unable to sleep for the last several days.  Patient reports that 4 weeks ago she was diagnosed with bronchitis and thought she was improving however her shortness of breath and wheezing has progressively gotten worse.  She also endorses progressive cough and at times is coughing small pieces of hard mucus and has had mild blood-tinged sputum at times.  She denies any overt chest pain aside from the coughing.  She denies any dizziness, lightheadedness, syncope or presyncope.  No orthopnea, PND or lower extremity edema.  She has been attempting to treat with Robitussin as well as albuterol  inhaler which  provides approximately 30 minutes of relief.   Patient offers no past medical history Medications albuterol  MDI from her bronchitis 4 weeks ago Allergies to codeine   Smoke no alcohol no  Operation tonsillectomy adenoidectomy myringotomy        Past Medical History:  Diagnosis Date  . ADHD   . Witnessed seizure-like activity (*)     Past Surgical History:  Procedure Laterality Date  . Adenoid    . Tonsillectomy    . Tympanostomy tube placement      Social History   Substance and Sexual Activity  Alcohol Use Yes   Comment: at times   Tobacco Use History[1] E-Cigarettes  . Vaping Use Current Every Day User   . Start Date    . Cartridges/Day CBD vaping   . Quit Date     Social History   Substance and Sexual Activity  Drug Use Not Currently         Allergies[2]  Home Medications   CYCLOBENZAPRINE  (FLEXERIL ) 10 MG TABLET    Take one tablet (10 mg total) by mouth 3 (three) times a day as needed for Muscle spasms.   NORETHINDRONE -ETHINYL ESTRADIOL -FERROUS FUMARATE (LOESTRIN-FE,MICROGESTIN-FE,JUNEL-FE,LARIN-FE) 1-20 MG-MCG PER TABLET    Take one tablet by mouth daily.    Primary Survey  Primary Survey  Review of Systems   Review of Systems  Respiratory:  Positive for cough, shortness of breath and wheezing.   Cardiovascular:  Negative for chest pain.  Gastrointestinal:  Negative for abdominal pain, nausea and vomiting.  Genitourinary:  Negative for vaginal bleeding.    Physical Exam  ED Triage Vitals  BP 02/23/24 2006 138/83  Heart Rate 02/23/24 2006 103  Resp 02/23/24 2010 20  SpO2 02/23/24 2006 96 %  Temp 02/23/24 2006 99.6 F (37.6 C)    Physical Exam  Nursing note and vitals reviewed. Constitutional: She appears well-developed. She does not appear distressed.  HENT:  Head: Normocephalic and atraumatic.  Eyes: EOM are intact. Pupils are equal, round, and reactive to light.  Neck: Normal range of motion. Neck supple.  Cardiovascular:  Normal rate and regular rhythm.  Pulmonary/Chest: Wheezing. Rhonchi.  Abdominal: Soft.  Musculoskeletal: Normal range of motion.     Cervical back: Normal range of motion and neck supple.   Neurological: She is alert and oriented to person, place, and time.  Skin: Skin is warm. Skin is dry.  Psychiatric: She has a normal mood and affect.     ED Course   Lab results:   CBC AND DIFFERENTIAL - Abnormal      Result Value   WBC 14.2 (*)    RBC 3.82 (*)    HGB 11.5     HCT 34.5     MCV 90.3     MCH 30.1     MCHC 33.3     Plt Ct 234     RDW SD 43.2     MPV 9.5     NRBC% 0.0     Absolute NRBC Count 0.00     NEUTROPHIL % 75.7     LYMPHOCYTE % 14.7     MONOCYTE % 4.6     Eosinophil % 4.3     BASOPHIL % 0.3     IG% 0.4     ABSOLUTE NEUTROPHIL COUNT 10.74 (*)    ABSOLUTE LYMPHOCYTE COUNT 2.08     Absolute Monocyte Count 0.65     Absolute Eosinophil Count 0.61 (*)    Absolute Basophil Count 0.04     Absolute Immature Granulocyte Count 0.06 (*)   COMPREHENSIVE METABOLIC PANEL - Abnormal   Na 139     Potassium 4.1     Cl 104     CO2 22     AGAP 13     Glucose 80     BUN 6     Creatinine 0.45 (*)    Ca 9.3     ALK PHOS 85     T Bili 0.2     Total Protein 6.7     Alb 3.8     GLOBULIN 2.9     ALBUMIN/GLOBULIN RATIO 1.3     BUN/CREAT RATIO 13.3     ALT 14     AST 15     eGFR 135     Comment: Normal GFR (glomerular filtration rate) > 60 mL/min/1.73 meters squared, < 60 may include impaired kidney function. Calculation based on the Chronic Kidney Disease Epidemiology Collaboration (CK-EPI)equation refit without adjustment for race.  URINALYSIS ONLY - NO SYMPTOMS - Abnormal   Urine Color Yellow     Urine Clarity Cloudy (*)    Urine Specific Gravity 1.009     Urine pH 6.0     Urine Protein - Dipstick Negative     Urine Glucose Negative     Urine Ketones Negative     Urine Bilirubin Negative     Urine Blood Negative     Urine Nitrite Negative     Urine Urobilinogen  <2     Urine Leukocyte Esterase 75 (*)    Urine Squamous Epithelial Cells 10-20 (*)  Urine WBC 3-5 (*)    Urine Bacteria 1+ (*)    UA Amorphous Few     UA Microscopic Yes Micro (*)   COVID-19, FLU A+B AND RSV - Normal   Flu A Negative     Flu B Negative     RSV PCR Negative     SARS-COV-2 Not Detected     Narrative:    SARS-COV-2 (COVID-19)PCR-Negative results do not preclude SARS-CoV-2 infection and should not be used as the sole basis for patient management decisions. Negative results must be combined with clinical observations, patient history, and epidemiological information.  Flu and/or RSV - Negative results do not preclude the presence of Flu or RSV virus and should not be used as the sole basis for treatment or other patient management decisions. False negative results may occur if virus is present at levels below the analytical limit of detection.  This test detects Influenza A, Influenza B, and Respiratory Syncytial Virus and SARS-COV-2 (COVID-19) by PCR.     D-DIMER, QUANTITATIVE - Normal   D-Dimer 0.40     Comment: The results of D-Dimer testing should be evaluated in the context of all clinical and laboratory data available. D-Dimer cut off value is 0.50 ug/ml FEU. D-Dimer assay can be used to exclude DVT & PE, but should not be used to exclude patients with: - Therapeutic dose anticoagulant therapy for >24 hours.  - Fibrinolytic therapy within previous 7 days  - Trauma or surgery within previous 4 weeks  - Disseminated malignancies  - Aortic aneurysm  - Sepsis, severe infections, pneumonia, severe skin infections  - Liver cirrhosis  - Pregnancy   LIGHT BLUE TOP  GOLD SST    Imaging:   XR CHEST AP PORTABLE   Narrative:    Single view of the chest.  HISTORY: Cough, shortness of breath.  There is no acute infiltrate.  There is no pleural effusion.  The cardiac silhouette is unremarkable.  The visualized osseous structures are unremarkable.     Impression:    IMPRESSION:  No acute pulmonary disease.  Electronically Signed by: Marinda Fleming, MD on 02/24/2024 1:42 AM     ECG: ECG Results          ECG 12 lead (In process)      Collection Time Result Time Acquisition Device Ventricular Rate Atrial Rate P-R Interval QRS Duration Q-T Interval QTC Calculation(Bazett) Calculated P Axis Calculated R Axis Calculated T Axis   02/23/24 20:00:48 02/23/24 20:41:38 D3K 78 78 148 74 362 412 69 74 18         Collection Time Result Time ECGDiag   02/23/24 20:00:48 02/23/24 20:41:38 Normal sinus rhythm Possible Anterior infarct (cited on or before 25-Dec-2019) Abnormal ECG When compared with ECG of 25-Dec-2019 14:16, No significant change was found                                                                                          Pre-Sedation Procedures    Medical Decision Making Patient presents with persistent asthma exacerbation not improving despite the use of inhalers  Workups been unremarkable laboratory studies chest x-ray viral panel negative  Discussed possible allergens Relatively new dog in the house which sleeps in their room Moved into a new house Discussed possibility of mold or mildew  Will provide short-term steroids    Amount and/or Complexity of Data Reviewed Labs: ordered. Radiology: ordered. ECG/medicine tests: ordered.  Risk Prescription drug management.          Provider Communication  New Prescriptions   PREDNISONE  (DELTASONE ) 20 MG TABLET    Take two tablets (40 mg dose) by mouth daily for 5 days.      Quantity: 10 tablet    Refills: 0    Modified Medications   No medications on file    Discontinued Medications   No medications on file    Clinical Impression Final diagnoses:  Mild intermittent asthma with exacerbation (*)  Pregnancy with 24 completed weeks gestation (*)    ED Disposition     ED Disposition   Discharge   Condition  Stable   Comment  --                   Electronically signed by:       [1] Social History Tobacco Use  Smoking Status Former  . Types: Cigarettes  Smokeless Tobacco Never  [2] Allergies Allergen Reactions  . Codeine  Itching and Nausea And Vomiting    hives   . Strawberry Hives    Unknown  Unknown     Lynwood JAYSON Life, MD 02/24/24 [phone]

## 2024-03-01 ENCOUNTER — Encounter: Payer: Self-pay | Admitting: Obstetrics and Gynecology

## 2024-03-01 ENCOUNTER — Ambulatory Visit: Admitting: Obstetrics and Gynecology

## 2024-03-01 VITALS — BP 135/86 | HR 99 | Wt 209.4 lb

## 2024-03-01 DIAGNOSIS — Z6791 Unspecified blood type, Rh negative: Secondary | ICD-10-CM | POA: Diagnosis not present

## 2024-03-01 DIAGNOSIS — Z3480 Encounter for supervision of other normal pregnancy, unspecified trimester: Secondary | ICD-10-CM

## 2024-03-01 DIAGNOSIS — F332 Major depressive disorder, recurrent severe without psychotic features: Secondary | ICD-10-CM

## 2024-03-01 DIAGNOSIS — Z3A25 25 weeks gestation of pregnancy: Secondary | ICD-10-CM

## 2024-03-01 DIAGNOSIS — O26899 Other specified pregnancy related conditions, unspecified trimester: Secondary | ICD-10-CM

## 2024-03-01 DIAGNOSIS — O26892 Other specified pregnancy related conditions, second trimester: Secondary | ICD-10-CM
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
## 2024-03-01 NOTE — Progress Notes (Unsigned)
 Pt on prednisone  for breathing problems.  No other concerns today.

## 2024-03-01 NOTE — Patient Instructions (Signed)

## 2024-03-06 ENCOUNTER — Telehealth: Admitting: Physician Assistant

## 2024-03-06 DIAGNOSIS — J4541 Moderate persistent asthma with (acute) exacerbation: Secondary | ICD-10-CM | POA: Diagnosis not present

## 2024-03-06 MED ORDER — ALBUTEROL SULFATE HFA 108 (90 BASE) MCG/ACT IN AERS: 1.0000 | INHALATION_SPRAY | Freq: Four times a day (QID) | RESPIRATORY_TRACT | 0 refills | Status: AC | PRN

## 2024-03-06 MED ORDER — FLUTICASONE PROPIONATE HFA 110 MCG/ACT IN AERO: 2.0000 | INHALATION_SPRAY | Freq: Two times a day (BID) | RESPIRATORY_TRACT | 1 refills | Status: AC
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
## 2024-03-06 NOTE — Patient Instructions (Addendum)
 Connie Chapman, thank you for joining Delon CHRISTELLA Dickinson, PA-C for today's virtual visit.  While this provider is not your primary care provider (PCP), if your PCP is located in our provider database this encounter information will be shared with them immediately following your visit.   A [facility] MyChart account gives you access to today's visit and all your visits, tests, and labs performed at [facility]  click here if you don't have a [facility] MyChart account or go to [email or web address]  Consent: (Patient) Connie Chapman provided verbal consent for this virtual visit at the beginning of the encounter.  Current Medications:  Current Outpatient Medications:    albuterol  (VENTOLIN  HFA) 108 (90 Base) MCG/ACT inhaler, Inhale 1-2 puffs into the lungs every 6 (six) hours as needed., Disp: 8 g, Rfl: 0   fluticasone  (FLOVENT  HFA) 110 MCG/ACT inhaler, Inhale 2 puffs into the lungs in the morning and at bedtime., Disp: 12 g, Rfl: 1   acetaminophen  (TYLENOL ) 325 MG tablet, Take 650 mg by mouth every 6 (six) hours as needed for headache., Disp: , Rfl:    Blood Pressure Monitoring (BLOOD PRESSURE KIT) DEVI, 1 Device by Does not apply route once a week. (Patient not taking: Reported on 03/01/2024), Disp: 1 each, Rfl: 0   chlorhexidine  (PERIDEX ) 0.12 % solution, Use as directed 15 mLs in the mouth or throat 2 (two) times daily. (Patient not taking: Reported on 03/01/2024), Disp: 120 mL, Rfl: 0   predniSONE  (STERAPRED UNI-PAK 21 TAB) 10 MG (21) TBPK tablet, Take by mouth daily. Take 6 tabs by mouth daily  for 2 days, then 5 tabs for 2 days, then 4 tabs for 2 days, then 3 tabs for 2 days, 2 tabs for 2 days, then 1 tab by mouth daily for 2 days, Disp: 42 tablet, Rfl: 0   Prenatal Vit-Fe Fumarate-FA (PRENATAL VITAMIN PO), Take 1 tablet by mouth daily., Disp: , Rfl:    Medications ordered in this encounter:  Meds ordered this encounter  Medications   fluticasone  (FLOVENT  HFA)  110 MCG/ACT inhaler    Sig: Inhale 2 puffs into the lungs in the morning and at bedtime.    Dispense:  12 g    Refill:  1    Formulary alternative substitution allowed    Supervising Provider:   BLAISE ALEENE KIDD [[id number]]   albuterol  (VENTOLIN  HFA) 108 (90 Base) MCG/ACT inhaler    Sig: Inhale 1-2 puffs into the lungs every 6 (six) hours as needed.    Dispense:  8 g    Refill:  0    Supervising Provider:   BLAISE ALEENE KIDD [[id number]]     *If you need refills on other medications prior to your next appointment, please contact your pharmacy*  Follow-Up: Call back or seek an in-person evaluation if the symptoms worsen or if the condition fails to improve as anticipated.  [facility] [phone]  Other Instructions  Asthma, Adult  Asthma is a long-term (chronic) condition that causes recurrent episodes in which the lower airways in the lungs become tight and narrow. The narrowing is caused by inflammation and tightening of the smooth muscle around the lower airways. Asthma episodes, also called asthma attacks or asthma flares, may cause coughing, making high-pitched whistling sounds when you breathe, most often when you breathe out (wheezing), shortness of breath, and chest pain. The airways may produce extra mucus caused by the inflammation and irritation. During an attack, it can be difficult to breathe.  Asthma attacks can range from minor to life-threatening. Asthma cannot be cured, but medicines and lifestyle changes can help control it and treat acute attacks. It is important to keep your asthma well controlled so the condition does not interfere with your daily life. What are the causes? This condition is believed to be caused by inherited (genetic) and environmental factors, but its exact cause is not known. What can trigger an asthma attack? Many things can bring on an asthma attack or make symptoms worse. These triggers are different for every person. Common  triggers include: Allergens and irritants like mold, dust, pet dander, cockroaches, pollen, air pollution, and chemical odors. Cigarette smoke. Weather changes and cold air. Stress and strong emotional responses such as crying or laughing hard. Certain medications such as aspirin or beta blockers. Infections and inflammatory conditions, such as the flu, a cold, pneumonia, or inflammation of the nasal membranes (rhinitis). Gastroesophageal reflux disease (GERD). What are the signs or symptoms? Symptoms may occur right after exposure to an asthma trigger or hours later and can vary by person. Common signs and symptoms include: Wheezing. Trouble breathing (shortness of breath). Excessive nighttime or early morning coughing. Chest tightness. Tiredness (fatigue) with minimal activity. Difficulty talking in complete sentences. Poor exercise tolerance. How is this diagnosed? This condition is diagnosed based on: A physical exam and your medical history. Tests, which may include: Lung function studies to evaluate the flow of air in your lungs. Allergy tests. Imaging tests, such as X-rays. How is this treated? There is no cure, but symptoms can be controlled with proper treatment. Treatment usually involves: Identifying and avoiding your asthma triggers. Inhaled medicines. Two types are commonly used to treat asthma, depending on severity: Controller medicines. These help prevent asthma symptoms from occurring. They are taken every day. Fast-acting reliever or rescue medicines. These quickly relieve asthma symptoms. They are used as needed and provide short-term relief. Using other medicines, such as: Allergy medicines, such as antihistamines, if your asthma attacks are triggered by allergens. Immune medicines (immunomodulators). These are medicines that help control the immune system. Using supplemental oxygen. This is only needed during a severe episode. Creating an asthma action plan. An  asthma action plan is a written plan for managing and treating your asthma attacks. This plan includes: A list of your asthma triggers and how to avoid them. Information about when medicines should be taken and when their dosage should be changed. Instructions about using a device called a peak flow meter. A peak flow meter measures how well the lungs are working and the severity of your asthma. It helps you monitor your condition. Follow these instructions at home: Take over-the-counter and prescription medicines only as told by your health care provider. Stay up to date on all vaccinations as recommended by your healthcare provider, including vaccines for the flu and pneumonia. Use a peak flow meter and keep track of your peak flow readings. Understand and use your asthma action plan to address any asthma flares. Do not smoke or allow anyone to smoke in your home. Contact a health care provider if: You have wheezing, shortness of breath, or a cough that is not responding to medicines. Your medicines are causing side effects, such as a rash, itching, swelling, or trouble breathing. You need to use a reliever medicine more than 2-3 times a week. Your peak flow reading is still at 50-79% of your personal best after following your action plan for 1 hour. You have a fever and shortness of  breath. Get help right away if: You are getting worse and do not respond to treatment during an asthma attack. You are short of breath when at rest or when doing very little physical activity. You have difficulty eating, drinking, or talking. You have chest pain or tightness. You develop a fast heartbeat or palpitations. You have a bluish color to your lips or fingernails. You are light-headed or dizzy, or you faint. Your peak flow reading is less than 50% of your personal best. You feel too tired to breathe normally. These symptoms may be an emergency. Get help right away. Call 911. Do not wait to see if  the symptoms will go away. Do not drive yourself to the hospital. Summary Asthma is a long-term (chronic) condition that causes recurrent episodes in which the airways become tight and narrow. Asthma episodes, also called asthma attacks or asthma flares, can cause coughing, wheezing, shortness of breath, and chest pain. Asthma cannot be cured, but medicines and lifestyle changes can help keep it well controlled and prevent asthma flares. Make sure you understand how to avoid triggers and how and when to use your medicines. Asthma attacks can range from minor to life-threatening. Get help right away if you have an asthma attack and do not respond to treatment with your usual rescue medicines. This information is not intended to replace advice given to you by your health care provider. Make sure you discuss any questions you have with your health care provider. Document Revised: 01/15/2021 Document Reviewed: 01/06/2021 Elsevier Patient Education  2024 Elsevier Inc.   If you have been instructed to have an in-person evaluation today at a local Urgent Care facility, please use the link below. It will take you to a list of all of our available [facility], including address, phone number and hours of operation. Please do not delay care.  [facility]  If you or a family member do not have a primary care provider, use the link below to schedule a visit and establish care. When you choose a [facility] primary care physician or advanced practice provider, you gain a long-term partner in health. Find a Primary Care Provider  Learn more about [facility]'s in-office and virtual care options: [facility] - Get Care Now

## 2024-03-22 ENCOUNTER — Ambulatory Visit: Payer: Self-pay | Admitting: Obstetrics and Gynecology

## 2024-03-22 ENCOUNTER — Other Ambulatory Visit

## 2024-03-22 ENCOUNTER — Encounter: Payer: Self-pay | Admitting: Obstetrics and Gynecology

## 2024-03-22 VITALS — BP 135/76 | HR 99 | Wt 216.2 lb

## 2024-03-22 DIAGNOSIS — J4530 Mild persistent asthma, uncomplicated: Secondary | ICD-10-CM | POA: Diagnosis not present

## 2024-03-22 DIAGNOSIS — O26893 Other specified pregnancy related conditions, third trimester: Secondary | ICD-10-CM | POA: Diagnosis not present

## 2024-03-22 DIAGNOSIS — Z23 Encounter for immunization: Secondary | ICD-10-CM | POA: Diagnosis not present

## 2024-03-22 DIAGNOSIS — O26899 Other specified pregnancy related conditions, unspecified trimester: Secondary | ICD-10-CM

## 2024-03-22 DIAGNOSIS — O360931 Maternal care for other rhesus isoimmunization, third trimester, fetus 1: Secondary | ICD-10-CM

## 2024-03-22 DIAGNOSIS — Z6791 Unspecified blood type, Rh negative: Secondary | ICD-10-CM

## 2024-03-22 DIAGNOSIS — Z3A28 28 weeks gestation of pregnancy: Secondary | ICD-10-CM

## 2024-03-22 DIAGNOSIS — Z3480 Encounter for supervision of other normal pregnancy, unspecified trimester: Secondary | ICD-10-CM

## 2024-03-22 MED ORDER — RHO D IMMUNE GLOBULIN 1500 UNIT/2ML IJ SOSY
300.0000 ug | PREFILLED_SYRINGE | Freq: Once | INTRAMUSCULAR | Status: AC
  Administered 2024-03-22: 300 ug via INTRAMUSCULAR

## 2024-03-22 NOTE — Progress Notes (Signed)
 ROB; GTT today  Pt recently diagnosed by her PCP, during this pregnancy, with asthma.   Pt has questions about how much the baby should be moving. States baby moves often, especially when lying down.   BP 129/92 and 135/76

## 2024-03-22 NOTE — Patient Instructions (Addendum)
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
## 2024-03-23 ENCOUNTER — Ambulatory Visit: Payer: Self-pay | Admitting: Obstetrics and Gynecology

## 2024-03-23 DIAGNOSIS — Z3480 Encounter for supervision of other normal pregnancy, unspecified trimester: Secondary | ICD-10-CM

## 2024-03-23 LAB — CBC
Hematocrit: 35.8 % (ref 34.0–46.6)
Hemoglobin: 11.7 g/dL (ref 11.1–15.9)
MCH: 30.1 pg (ref 26.6–33.0)
MCHC: 32.7 g/dL (ref 31.5–35.7)
MCV: 92 fL (ref 79–97)
Platelets: 239 x10E3/uL (ref 150–450)
RBC: 3.89 x10E6/uL (ref 3.77–5.28)
RDW: 13.2 % (ref 11.7–15.4)
WBC: 14.3 x10E3/uL — ABNORMAL HIGH (ref 3.4–10.8)

## 2024-03-23 LAB — GLUCOSE TOLERANCE, 2 HOURS W/ 1HR
Glucose, 1 hour: 177 mg/dL (ref 70–179)
Glucose, 2 hour: 140 mg/dL (ref 70–152)
Glucose, Fasting: 74 mg/dL (ref 70–91)

## 2024-03-23 LAB — SYPHILIS: RPR W/REFLEX TO RPR TITER AND TREPONEMAL ANTIBODIES, TRADITIONAL SCREENING AND DIAGNOSIS ALGORITHM: RPR Ser Ql: NONREACTIVE

## 2024-03-23 LAB — HIV ANTIBODY (ROUTINE TESTING W REFLEX): HIV Screen 4th Generation wRfx: NONREACTIVE

## 2024-04-10 ENCOUNTER — Other Ambulatory Visit (HOSPITAL_COMMUNITY)
Admission: RE | Admit: 2024-04-10 | Discharge: 2024-04-10 | Disposition: A | Discharge status: Home / Self Care | Source: Ambulatory Visit | Attending: Obstetrics and Gynecology | Admitting: Obstetrics and Gynecology

## 2024-04-10 ENCOUNTER — Encounter: Payer: Self-pay | Admitting: Obstetrics and Gynecology

## 2024-04-10 ENCOUNTER — Ambulatory Visit: Admitting: Obstetrics and Gynecology

## 2024-04-10 VITALS — BP 143/96 | HR 102 | Wt 219.8 lb

## 2024-04-10 DIAGNOSIS — N898 Other specified noninflammatory disorders of vagina: Secondary | ICD-10-CM | POA: Diagnosis not present

## 2024-04-10 DIAGNOSIS — O26893 Other specified pregnancy related conditions, third trimester: Secondary | ICD-10-CM

## 2024-04-10 DIAGNOSIS — Z6791 Unspecified blood type, Rh negative: Secondary | ICD-10-CM

## 2024-04-10 DIAGNOSIS — O99210 Obesity complicating pregnancy, unspecified trimester: Secondary | ICD-10-CM | POA: Insufficient documentation

## 2024-04-10 DIAGNOSIS — R030 Elevated blood-pressure reading, without diagnosis of hypertension: Secondary | ICD-10-CM

## 2024-04-10 DIAGNOSIS — O26899 Other specified pregnancy related conditions, unspecified trimester: Secondary | ICD-10-CM

## 2024-04-10 DIAGNOSIS — Z3A30 30 weeks gestation of pregnancy: Secondary | ICD-10-CM

## 2024-04-10 DIAGNOSIS — Z3480 Encounter for supervision of other normal pregnancy, unspecified trimester: Secondary | ICD-10-CM

## 2024-04-10 NOTE — Progress Notes (Unsigned)
 Pt presents for ROB visit. Pt has concerns about fetal movement. C/o vaginal itching and swelling
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
## 2024-04-11 ENCOUNTER — Other Ambulatory Visit: Payer: Self-pay

## 2024-04-11 DIAGNOSIS — R030 Elevated blood-pressure reading, without diagnosis of hypertension: Secondary | ICD-10-CM

## 2024-04-11 LAB — PROTEIN / CREATININE RATIO, URINE
Creatinine, Urine: 42.6 mg/dL
Protein, Ur: 30.2 mg/dL
Protein/Creat Ratio: 709 mg/g{creat} — ABNORMAL HIGH (ref 0–200)

## 2024-04-12 ENCOUNTER — Other Ambulatory Visit: Payer: Self-pay | Admitting: Obstetrics and Gynecology

## 2024-04-12 ENCOUNTER — Telehealth: Payer: Self-pay

## 2024-04-12 ENCOUNTER — Telehealth: Payer: Self-pay | Admitting: Obstetrics and Gynecology

## 2024-04-12 DIAGNOSIS — O1490 Unspecified pre-eclampsia, unspecified trimester: Secondary | ICD-10-CM | POA: Insufficient documentation

## 2024-04-12 LAB — CERVICOVAGINAL ANCILLARY ONLY
Bacterial Vaginitis (gardnerella): POSITIVE — AB
Candida Glabrata: NEGATIVE
Candida Vaginitis: POSITIVE — AB
Chlamydia: NEGATIVE
Comment: NEGATIVE
Comment: NEGATIVE
Comment: NEGATIVE
Comment: NEGATIVE
Comment: NEGATIVE
Comment: NORMAL
Neisseria Gonorrhea: NEGATIVE
Trichomonas: NEGATIVE

## 2024-04-12 LAB — COMPREHENSIVE METABOLIC PANEL WITH GFR
ALT: 12 IU/L (ref 0–32)
AST: 15 IU/L (ref 0–40)
Albumin: 3.6 g/dL — ABNORMAL LOW (ref 4.0–5.0)
Alkaline Phosphatase: 114 IU/L (ref 41–116)
BUN/Creatinine Ratio: 7 — ABNORMAL LOW (ref 9–23)
BUN: 4 mg/dL — ABNORMAL LOW (ref 6–20)
Bilirubin Total: 0.2 mg/dL (ref 0.0–1.2)
CO2: 20 mmol/L (ref 20–29)
Calcium: 9.2 mg/dL (ref 8.7–10.2)
Chloride: 103 mmol/L (ref 96–106)
Creatinine, Ser: 0.55 mg/dL — ABNORMAL LOW (ref 0.57–1.00)
Globulin, Total: 2.4 g/dL (ref 1.5–4.5)
Glucose: 99 mg/dL (ref 70–99)
Potassium: 3.5 mmol/L (ref 3.5–5.2)
Sodium: 137 mmol/L (ref 134–144)
Total Protein: 6 g/dL (ref 6.0–8.5)
eGFR: 129 mL/min/1.73

## 2024-04-12 LAB — CBC
Hematocrit: 35.4 % (ref 34.0–46.6)
Hemoglobin: 11.9 g/dL (ref 11.1–15.9)
MCH: 30.1 pg (ref 26.6–33.0)
MCHC: 33.6 g/dL (ref 31.5–35.7)
MCV: 89 fL (ref 79–97)
Platelets: 210 x10E3/uL (ref 150–450)
RBC: 3.96 x10E6/uL (ref 3.77–5.28)
RDW: 13.2 % (ref 11.7–15.4)
WBC: 15.2 x10E3/uL — ABNORMAL HIGH (ref 3.4–10.8)

## 2024-04-12 MED ORDER — FLUCONAZOLE 150 MG PO TABS: 150.0000 mg | ORAL_TABLET | Freq: Once | ORAL | 0 refills | Status: AC

## 2024-04-12 MED ORDER — METRONIDAZOLE 0.75 % VA GEL: 1.0000 | Freq: Every day | VAGINAL | 0 refills | Status: DC

## 2024-04-12 NOTE — Telephone Encounter (Signed)
 Returned call and advised pt that provider stated that she will call today to discuss labs.

## 2024-04-12 NOTE — Telephone Encounter (Signed)
 Called to discuss new onset pre-eclampsia based on elevated BP and protein creatinine of 709 discussed with MD who agrees dx. She reports home bps 140s/80-90s.Liver enzymes normal, cbc normal  Has not had an anatomy scan yet, message sent to MFM to discuss new dx and need for antenatal testing DIscussed PEC s&s to monitor at home and when to be evaluated

## 2024-04-17 ENCOUNTER — Ambulatory Visit: Payer: Self-pay

## 2024-04-17 VITALS — BP 147/81 | HR 89 | Wt 218.9 lb

## 2024-04-17 DIAGNOSIS — Z0130 Encounter for examination of blood pressure without abnormal findings: Secondary | ICD-10-CM

## 2024-04-17 DIAGNOSIS — O1490 Unspecified pre-eclampsia, unspecified trimester: Secondary | ICD-10-CM

## 2024-04-17 DIAGNOSIS — Z3A32 32 weeks gestation of pregnancy: Secondary | ICD-10-CM

## 2024-04-17 NOTE — Progress Notes (Signed)
..  Subjective:  Connie Chapman is a 29 y.o. pregnant female here for BP check at [redacted]w[redacted]d. She denies any abnormal symptoms today and reports a headache yesterday that was relieved by Tylenol . She previously discussed labs and new onset pre-eclampsia with the provider on 04/12/24 and is here for follow up BP check.  Hypertension ROS: no TIA's, no chest pain on exertion, no dyspnea on exertion, and no swelling of ankles.    Objective:  BP (!) 147/81   Pulse 89   Wt 218 lb 14.4 oz (99.3 kg)   LMP 09/08/2023 (Approximate)   BMI 36.43 kg/m   Appearance alert, well appearing, and in no distress. General exam BP noted to be stable today in office.    Assessment:   Blood Pressure stable.   Plan:  Current treatment plan is effective, no change in therapy.SABRA Consulted with provider in office and advised pt to continue to monitor BP and if severe ranges and/or symptoms report to mau, pt voiced understanding. Next appt is 04/21/24 for U/S and 04/27/24 for ROB.

## 2024-04-18 DIAGNOSIS — O1490 Unspecified pre-eclampsia, unspecified trimester: Secondary | ICD-10-CM

## 2024-04-21 ENCOUNTER — Ambulatory Visit: Attending: Physician Assistant | Admitting: Obstetrics

## 2024-04-21 ENCOUNTER — Other Ambulatory Visit: Payer: Self-pay | Admitting: *Deleted

## 2024-04-21 ENCOUNTER — Ambulatory Visit (HOSPITAL_BASED_OUTPATIENT_CLINIC_OR_DEPARTMENT_OTHER)

## 2024-04-21 VITALS — BP 142/92 | HR 92

## 2024-04-21 DIAGNOSIS — Z3A32 32 weeks gestation of pregnancy: Secondary | ICD-10-CM | POA: Insufficient documentation

## 2024-04-21 DIAGNOSIS — O1493 Unspecified pre-eclampsia, third trimester: Secondary | ICD-10-CM | POA: Insufficient documentation

## 2024-04-21 DIAGNOSIS — O99213 Obesity complicating pregnancy, third trimester: Secondary | ICD-10-CM

## 2024-04-21 DIAGNOSIS — Z363 Encounter for antenatal screening for malformations: Secondary | ICD-10-CM | POA: Insufficient documentation

## 2024-04-21 DIAGNOSIS — E669 Obesity, unspecified: Secondary | ICD-10-CM | POA: Diagnosis not present

## 2024-04-21 DIAGNOSIS — Z3480 Encounter for supervision of other normal pregnancy, unspecified trimester: Secondary | ICD-10-CM

## 2024-04-21 DIAGNOSIS — O99210 Obesity complicating pregnancy, unspecified trimester: Secondary | ICD-10-CM

## 2024-04-21 DIAGNOSIS — O1490 Unspecified pre-eclampsia, unspecified trimester: Secondary | ICD-10-CM

## 2024-04-21 NOTE — Progress Notes (Signed)
 MFM Consult Note  Connie Chapman is currently at [redacted]w[redacted]d. She was seen today for a detailed fetal anatomy scan due to maternal obesity.    She was recently diagnosed with preeclampsia based on her elevated blood pressures and a P/C ratio which predicts 709 mg of protein.  Her PIH labs drawn last week were all within normal limits.  She denies any history of hypertension prior to pregnancy and is not treated with any medications for her high blood pressure.  She reports that she is monitoring her blood pressures at home and they have all been in the 140s over 80s range.  She denies any signs or symptoms of preeclampsia.  Her blood pressures today were 142/92 and 147/94.  She had a cell free DNA test earlier in her pregnancy which indicated a low risk for trisomy 41, 2, and 13. A female fetus is predicted.   Sonographic findings Single intrauterine pregnancy at 32w 2d. Fetal cardiac activity:  Observed and appears normal. Presentation: [place]ree[place]. The views of the fetal anatomy were limited today due to maternal body habitus, the fetal position, and her advanced gestational age. Fetal biometry shows the estimated fetal weight of 5 lb 3 oz, 2347 grams (90%). Amniotic fluid: Within normal limits.  MVP: 5.17 cm.  AFI: 13.19 cm. Placenta: Anterior. BPP: 8/8.  The patient was informed that anomalies may be missed due to technical limitations. If the fetus is in a suboptimal position or maternal habitus is increased, visualization of the fetus in the maternal uterus may be impaired.  Outpatient management of preeclampsia  The implications and management of preeclampsia was discussed with the patient and her partner.    She was advised that preeclampsia can affect both the mother and the fetus.   In the mother, preeclampsia may cause a rise in blood pressures and it can affect the mother's kidney, liver and platelet functions.  It may also cause the mother to have seizures.    In the fetus, it  may cause growth restriction and oligohydramnios.    She understands that delivery is the only treatment for preeclampsia.  As she is currently asymptomatic and the fetal growth and fetal testing are within normal limits, she was advised that outpatient management of preeclampsia is reasonable.    However, I would have a low threshold for admission to the hospital for inpatient management should her blood pressures be elevated in the 150s to high 90s range.  She should be seen for twice-weekly fetal testing and blood pressure checks until delivery.  The patient should have an NST performed in your office each week.  We will see her on a different day each week for a BPP and amniotic fluid check.  She should also be followed with weekly PIH labs.  The goal for her delivery would be at around 37 weeks.    However should her blood pressures continue to be elevated, delivery should be considered at 34 weeks or greater.   She should receive a complete course of antenatal corticosteroids should she require delivery before 34 weeks.  Preeclampsia precautions were reviewed today.  She was advised to go into the hospital should she experience any signs or symptoms of preeclampsia.  She will return to our office in 1 week for a BPP.  The patient stated that all of her questions were answered.   A total of 45 minutes was spent counseling and coordinating the care for this patient.  Greater than 50% of the time was  spent in direct face-to-face contact.

## 2024-04-25 ENCOUNTER — Encounter: Payer: Self-pay | Admitting: Physician Assistant

## 2024-04-25 DIAGNOSIS — O3660X0 Maternal care for excessive fetal growth, unspecified trimester, not applicable or unspecified: Secondary | ICD-10-CM | POA: Insufficient documentation

## 2024-04-27 ENCOUNTER — Encounter (HOSPITAL_COMMUNITY): Payer: Self-pay | Admitting: Obstetrics and Gynecology

## 2024-04-27 ENCOUNTER — Inpatient Hospital Stay (HOSPITAL_COMMUNITY)
Admission: AD | Admit: 2024-04-27 | Discharge: 2024-04-27 | Disposition: A | Discharge status: Home / Self Care | Attending: Obstetrics and Gynecology | Admitting: Obstetrics and Gynecology

## 2024-04-27 ENCOUNTER — Ambulatory Visit: Payer: Self-pay | Admitting: Physician Assistant

## 2024-04-27 VITALS — BP 142/105 | HR 88 | Wt 220.2 lb

## 2024-04-27 DIAGNOSIS — O26899 Other specified pregnancy related conditions, unspecified trimester: Secondary | ICD-10-CM

## 2024-04-27 DIAGNOSIS — O1493 Unspecified pre-eclampsia, third trimester: Secondary | ICD-10-CM | POA: Diagnosis present

## 2024-04-27 DIAGNOSIS — Z6791 Unspecified blood type, Rh negative: Secondary | ICD-10-CM | POA: Diagnosis not present

## 2024-04-27 DIAGNOSIS — Z6831 Body mass index (BMI) 31.0-31.9, adult: Secondary | ICD-10-CM

## 2024-04-27 DIAGNOSIS — Z3A33 33 weeks gestation of pregnancy: Secondary | ICD-10-CM | POA: Insufficient documentation

## 2024-04-27 DIAGNOSIS — O1490 Unspecified pre-eclampsia, unspecified trimester: Secondary | ICD-10-CM

## 2024-04-27 DIAGNOSIS — O3663X0 Maternal care for excessive fetal growth, third trimester, not applicable or unspecified: Secondary | ICD-10-CM | POA: Diagnosis not present

## 2024-04-27 DIAGNOSIS — Z79899 Other long term (current) drug therapy: Secondary | ICD-10-CM | POA: Insufficient documentation

## 2024-04-27 DIAGNOSIS — O26893 Other specified pregnancy related conditions, third trimester: Secondary | ICD-10-CM | POA: Diagnosis not present

## 2024-04-27 DIAGNOSIS — Z3480 Encounter for supervision of other normal pregnancy, unspecified trimester: Secondary | ICD-10-CM

## 2024-04-27 HISTORY — DX: Unspecified asthma, uncomplicated: J45.909

## 2024-04-27 LAB — COMPREHENSIVE METABOLIC PANEL WITH GFR
ALT: 11 U/L (ref 0–44)
AST: 20 U/L (ref 15–41)
Albumin: 3.5 g/dL (ref 3.5–5.0)
Alkaline Phosphatase: 122 U/L (ref 38–126)
Anion gap: 13 (ref 5–15)
BUN: <5 mg/dL — ABNORMAL LOW (ref 6–20)
CO2: 19 mmol/L — ABNORMAL LOW (ref 22–32)
Calcium: 8.7 mg/dL — ABNORMAL LOW (ref 8.9–10.3)
Chloride: 104 mmol/L (ref 98–111)
Creatinine, Ser: 0.53 mg/dL (ref 0.44–1.00)
GFR, Estimated: >60 mL/min
Glucose, Bld: 144 mg/dL — ABNORMAL HIGH (ref 70–99)
Potassium: 3.5 mmol/L (ref 3.5–5.1)
Sodium: 136 mmol/L (ref 135–145)
Total Bilirubin: 0.3 mg/dL (ref 0.0–1.2)
Total Protein: 6.8 g/dL (ref 6.5–8.1)

## 2024-04-27 LAB — CBC
HCT: 36.6 % (ref 36.0–46.0)
Hemoglobin: 12.6 g/dL (ref 12.0–15.0)
MCH: 29.9 pg (ref 26.0–34.0)
MCHC: 34.4 g/dL (ref 30.0–36.0)
MCV: 86.9 fL (ref 80.0–100.0)
Platelets: 227 K/uL (ref 150–400)
RBC: 4.21 MIL/uL (ref 3.87–5.11)
RDW: 13.4 % (ref 11.5–15.5)
WBC: 14.8 K/uL — ABNORMAL HIGH (ref 4.0–10.5)
nRBC: 0 % (ref 0.0–0.2)

## 2024-04-27 LAB — PROTEIN / CREATININE RATIO, URINE
Creatinine, Urine: 35 mg/dL
Protein Creatinine Ratio: 0.9 mg/mg — ABNORMAL HIGH
Total Protein, Urine: 33 mg/dL

## 2024-04-27 MED ORDER — NIFEDIPINE ER OSMOTIC RELEASE 30 MG PO TB24: 30.0000 mg | ORAL_TABLET | Freq: Every day | ORAL | 0 refills | Status: DC

## 2024-04-27 MED ORDER — NIFEDIPINE ER OSMOTIC RELEASE 30 MG PO TB24
30.0000 mg | ORAL_TABLET | Freq: Once | ORAL | Status: AC
  Administered 2024-04-27: 30 mg via ORAL
  Filled 2024-04-27: qty 1
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
## 2024-04-27 NOTE — [facility]U Provider Note (Signed)
 " History     CSN: [id number]  Arrival date and time: 04/27/24 1650   Event Date/Time   First Provider Initiated Contact with Patient 04/27/24 1753      Chief Complaint  Patient presents with   Hypertension   HPI Ms. Connie Chapman is a 29 y.o. year old G44P0000 female at [redacted]w[redacted]d weeks gestation who presents to MAU after being seen for her ROB visit. She has been dx'd with PEC, but her BPs were more elevated in the office today. She was sent here today for repeat labs, NST and evaluation. Per office notes she had some edema in both hands and feet. She denies any H/A, blurry vision or RUQ pain. She receives [facility] with [facility]; next appt is with MFM tomorrow.  Her partner is present and contributing to the history taking.   OB History     Gravida  1   Para  0   Term  0   Preterm  0   AB  0   Living  0      SAB  0   IAB  0   Ectopic  0   Multiple  0   Live Births  0           Past Medical History:  Diagnosis Date   ADD (attention deficit disorder with hyperactivity)    Asthma    Endometriosis    Seasonal allergies    Wears glasses    Goes to Dr. Geronimo Slack   Wears glasses     Past Surgical History:  Procedure Laterality Date   ADENOIDECTOMY     TONSILLECTOMY  2007   TYMPANOSTOMY TUBE PLACEMENT  2000, 2004   TYMPANOSTOMY TUBE PLACEMENT      Family History  Problem Relation Age of Onset   Hearing loss Mother    Scoliosis Mother    Endometriosis Mother    Hypertension Mother    Other Mother        Tumor Cerebri   Hyperlipidemia Father    Sleep apnea Father    Hypertension Other    Thyroid disease Other    Heart attack Other     Social History[1]  Allergies: Allergies[2]  No medications prior to admission.    Review of Systems  Constitutional: Negative.   HENT: Negative.    Eyes: Negative.   Gastrointestinal: Negative.   Endocrine: Negative.   Genitourinary:        (+) FM  Musculoskeletal: Negative.   Skin: Negative.    Allergic/Immunologic: Negative.   Neurological: Negative.   Hematological: Negative.   Psychiatric/Behavioral: Negative.     Physical Exam   Patient Vitals for the past 24 hrs:  BP Temp Pulse Resp SpO2 Height Weight  04/27/24 2000 (!) 148/99 -- 92 -- 99 % -- --  04/27/24 1949 (!) 140/99 -- 98 -- -- -- --  04/27/24 1942 (!) 149/88 -- 94 -- 99 % -- --  04/27/24 1920 -- -- -- -- 97 % -- --  04/27/24 1916 (!) 140/98 -- (!) 103 -- 97 % -- --  04/27/24 1910 -- -- -- -- 97 % -- --  04/27/24 1901 (!) 133/105 -- 98 -- 97 % -- --  04/27/24 1846 (!) 134/93 -- 97 -- 99 % -- --  04/27/24 1831 (!) 130/93 -- 99 -- 98 % -- --  04/27/24 1816 (!) 143/95 -- (!) 107 -- 97 % -- --  04/27/24 1801 (!) 149/102 -- (!) 112 -- 98 % -- --  04/27/24 1746 (!) 151/99 -- (!) 109 -- 98 % -- --  04/27/24 1731 (!) 146/101 -- 100 -- 97 % -- --  04/27/24 1726 (!) 143/100 -- (!) 117 -- 98 % -- --  04/27/24 1712 (!) 155/90 98 F (36.7 C) (!) 114 18 -- 5' 5 (1.651 m) 99.9 kg    Physical Exam Vitals and nursing note reviewed.  Constitutional:      Appearance: Normal appearance. She is obese.  Cardiovascular:     Rate and Rhythm: Regular rhythm. Tachycardia present.     Heart sounds: Normal heart sounds.  Pulmonary:     Effort: Pulmonary effort is normal.     Breath sounds: Normal breath sounds.  Abdominal:     General: Bowel sounds are normal.     Palpations: Abdomen is soft.  Musculoskeletal:        General: Normal range of motion.  Skin:    General: Skin is warm and dry.  Neurological:     Mental Status: She is alert and oriented to person, place, and time.  Psychiatric:        Mood and Affect: Mood normal.        Behavior: Behavior normal.        Thought Content: Thought content normal.        Judgment: Judgment normal.    REACTIVE NST - FHR: 145 bpm / moderate variability / accels present / decels absent / TOCO: regular every 5 mins  MAU Course  Procedures  MDM CCUA CBC CMP P/C  Ratio Serial BP's   Results for orders placed or performed during the hospital encounter of 04/27/24 (from the past 24 hours)  CBC     Status: Abnormal   Collection Time: 04/27/24  5:09 PM  Result Value Ref Range   WBC 14.8 (H) 4.0 - 10.5 K/uL   RBC 4.21 3.87 - 5.11 MIL/uL   Hemoglobin 12.6 12.0 - 15.0 g/dL   HCT 63.3 63.9 - 53.9 %   MCV 86.9 80.0 - 100.0 fL   MCH 29.9 26.0 - 34.0 pg   MCHC 34.4 30.0 - 36.0 g/dL   RDW 86.5 88.4 - 84.4 %   Platelets 227 150 - 400 K/uL   nRBC 0.0 0.0 - 0.2 %  Comprehensive metabolic panel with GFR     Status: Abnormal   Collection Time: 04/27/24  5:09 PM  Result Value Ref Range   Sodium 136 135 - 145 mmol/L   Potassium 3.5 3.5 - 5.1 mmol/L   Chloride 104 98 - 111 mmol/L   CO2 19 (L) 22 - 32 mmol/L   Glucose, Bld 144 (H) 70 - 99 mg/dL   BUN <5 (L) 6 - 20 mg/dL   Creatinine, Ser 9.46 0.44 - 1.00 mg/dL   Calcium 8.7 (L) 8.9 - 10.3 mg/dL   Total Protein 6.8 6.5 - 8.1 g/dL   Albumin 3.5 3.5 - 5.0 g/dL   AST 20 15 - 41 U/L   ALT 11 0 - 44 U/L   Alkaline Phosphatase 122 38 - 126 U/L   Total Bilirubin 0.3 0.0 - 1.2 mg/dL   GFR, Estimated >39 >39 mL/min   Anion gap 13 5 - 15  Protein / creatinine ratio, urine     Status: Abnormal   Collection Time: 04/27/24  5:33 PM  Result Value Ref Range   Creatinine, Urine 35 mg/dL   Total Protein, Urine 33 mg/dL   Protein Creatinine Ratio 0.9 (H) <0.2 mg/mg     *  Consult with Dr. Ozan @ [phone] - notified of patient's complaints, assessments, lab results, Dr. Sima recommended tx plan start on Procardia  30 mg every day, BP check with MFM appt tomorrow and F/U BP check/OB appt next week at [facility] - ok to d/c home.  Assessment and Plan  1. Pre-eclampsia affecting pregnancy, antepartum (Primary) - Started on Procardia  XL 30 mg po daily - Advised to take BP every day, call for BPs >/= 160/110 - F/U with [facility] next week for ROB and BP recheck  2. [redacted] weeks gestation of pregnancy   - Discharge home - Needs to be  scheduled appt with [facility] on 05/01/2024 - Patient verbalized an understanding of the plan of care and agrees.   Ala Cart, CNM 04/27/2024, 5:53 PM       [1]  Social History Tobacco Use   Smoking status: Never    Passive exposure: Yes   Smokeless tobacco: Never  Vaping Use   Vaping status: Former   Start date: 10/10/2017   Substances: Nicotine, Flavoring  Substance Use Topics   Alcohol use: Not Currently    Comment: occ   Drug use: Never  [2]  Allergies Allergen Reactions   Codeine  Nausea And Vomiting   "

## 2024-04-27 NOTE — MAU Note (Signed)
 Pt informed that the ultrasound is considered a limited OB ultrasound and is not intended to be a complete ultrasound exam.  Patient also informed that the ultrasound is not being completed with the intent of assessing for fetal or placental anomalies or any pelvic abnormalities.  Explained that the purpose of todays ultrasound is to assess for  presentation.  Patient acknowledges the purpose of the exam and the limitations of the study.      Verified by Letha, CNM [place].

## 2024-04-27 NOTE — MAU Note (Signed)
 Connie Chapman is a 29 y.o. at [redacted]w[redacted]d here in MAU reporting: sent from office for elevated b/p. Denies any HA visual changes or upper abd pain. Good fetal movement   LMP:  Onset of complaint: today Pain score: 0 Vitals:   04/27/24 1712  BP: (!) 155/90  Pulse: (!) 114  Resp: 18  Temp: 98 F (36.7 C)     FHT: 147  Lab orders placed from triage: PIH LAbs

## 2024-04-27 NOTE — Progress Notes (Signed)
 Pt presents for rob. Pt has no questions or concerns at this time.

## 2024-04-28 ENCOUNTER — Other Ambulatory Visit

## 2024-04-28 ENCOUNTER — Ambulatory Visit: Attending: Maternal & Fetal Medicine | Admitting: Maternal & Fetal Medicine

## 2024-04-28 VITALS — BP 165/98 | HR 82

## 2024-04-28 DIAGNOSIS — O99210 Obesity complicating pregnancy, unspecified trimester: Secondary | ICD-10-CM | POA: Diagnosis present

## 2024-04-28 DIAGNOSIS — E669 Obesity, unspecified: Secondary | ICD-10-CM | POA: Diagnosis not present

## 2024-04-28 DIAGNOSIS — Z3A33 33 weeks gestation of pregnancy: Secondary | ICD-10-CM | POA: Insufficient documentation

## 2024-04-28 DIAGNOSIS — O1490 Unspecified pre-eclampsia, unspecified trimester: Secondary | ICD-10-CM

## 2024-04-28 DIAGNOSIS — O1493 Unspecified pre-eclampsia, third trimester: Secondary | ICD-10-CM | POA: Diagnosis not present

## 2024-04-28 DIAGNOSIS — O99213 Obesity complicating pregnancy, third trimester: Secondary | ICD-10-CM | POA: Diagnosis not present
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
## 2024-04-28 NOTE — Addendum Note (Signed)
 Addended by: WILLIAM DELORA SAILOR on: 04/28/2024 04:00 PM   Modules accepted: Orders

## 2024-05-02 ENCOUNTER — Ambulatory Visit

## 2024-05-02 ENCOUNTER — Ambulatory Visit: Attending: Obstetrics and Gynecology

## 2024-05-02 VITALS — BP 149/91 | HR 82

## 2024-05-02 DIAGNOSIS — Z3A33 33 weeks gestation of pregnancy: Secondary | ICD-10-CM | POA: Diagnosis not present

## 2024-05-02 DIAGNOSIS — O1493 Unspecified pre-eclampsia, third trimester: Secondary | ICD-10-CM | POA: Insufficient documentation

## 2024-05-02 DIAGNOSIS — Z3A34 34 weeks gestation of pregnancy: Secondary | ICD-10-CM | POA: Diagnosis not present

## 2024-05-02 DIAGNOSIS — O1490 Unspecified pre-eclampsia, unspecified trimester: Secondary | ICD-10-CM

## 2024-05-02 DIAGNOSIS — Z3480 Encounter for supervision of other normal pregnancy, unspecified trimester: Secondary | ICD-10-CM

## 2024-05-02 NOTE — Procedures (Deleted)
 Connie Chapman 03-05-1996 [redacted]w[redacted]d  Fetus A Non-Stress Test Interpretation for 05/02/24  Indication: Pre-Eclampsia - NST only  Fetal Heart Rate A Mode: External Baseline Rate (A): 140 bpm Variability: Moderate Accelerations: 15 x 15 Decelerations: None Multiple birth?: No  Uterine Activity Mode: Palpation, Toco Contraction Frequency (min): 2.5-5.5 Contraction Duration (sec): 50-120 Contraction Quality:  (non-palpable; pt not feeling) Resting Tone Palpated: Relaxed  Interpretation (Fetal Testing) Nonstress Test Interpretation: Reactive Comments: Reviewed with Dr. William

## 2024-05-02 NOTE — Procedures (Signed)
 Connie Chapman [birth year] [redacted]w[redacted]d  Fetus A Non-Stress Test Interpretation for 05/02/24  Indication: Pre-Eclampsia - NST only  Fetal Heart Rate A Mode: External Baseline Rate (A): 140 bpm Variability: Moderate Accelerations: 15 x 15 Decelerations: None Multiple birth?: No  Uterine Activity Mode: Palpation, Toco Contraction Frequency (min): 2.2-5.5 Contraction Duration (sec): 50-120 Contraction Quality:  (non-palpable; pt reports not feeling) Resting Tone Palpated: Relaxed Resting Time: Adequate  Interpretation (Fetal Testing) Nonstress Test Interpretation: Reactive Comments: Reviewed with Dr. William

## 2024-05-03 ENCOUNTER — Inpatient Hospital Stay (HOSPITAL_COMMUNITY)
Admission: AD | Admit: 2024-05-03 | Discharge: 2024-05-07 | DRG: 787 | Disposition: A | Attending: Obstetrics & Gynecology | Admitting: Obstetrics & Gynecology

## 2024-05-03 ENCOUNTER — Encounter (HOSPITAL_COMMUNITY): Payer: Self-pay | Admitting: Obstetrics and Gynecology

## 2024-05-03 ENCOUNTER — Other Ambulatory Visit: Payer: Self-pay

## 2024-05-03 DIAGNOSIS — O3663X0 Maternal care for excessive fetal growth, third trimester, not applicable or unspecified: Secondary | ICD-10-CM | POA: Diagnosis present

## 2024-05-03 DIAGNOSIS — O1413 Severe pre-eclampsia, third trimester: Principal | ICD-10-CM | POA: Diagnosis present

## 2024-05-03 DIAGNOSIS — O99214 Obesity complicating childbirth: Secondary | ICD-10-CM | POA: Diagnosis present

## 2024-05-03 DIAGNOSIS — Z79899 Other long term (current) drug therapy: Secondary | ICD-10-CM

## 2024-05-03 DIAGNOSIS — O321XX0 Maternal care for breech presentation, not applicable or unspecified: Secondary | ICD-10-CM | POA: Diagnosis present

## 2024-05-03 DIAGNOSIS — Z7951 Long term (current) use of inhaled steroids: Secondary | ICD-10-CM

## 2024-05-03 DIAGNOSIS — Z8249 Family history of ischemic heart disease and other diseases of the circulatory system: Secondary | ICD-10-CM

## 2024-05-03 DIAGNOSIS — Z98891 History of uterine scar from previous surgery: Secondary | ICD-10-CM

## 2024-05-03 DIAGNOSIS — Z87891 Personal history of nicotine dependence: Secondary | ICD-10-CM

## 2024-05-03 DIAGNOSIS — Z6791 Unspecified blood type, Rh negative: Secondary | ICD-10-CM

## 2024-05-03 DIAGNOSIS — O9081 Anemia of the puerperium: Secondary | ICD-10-CM | POA: Diagnosis not present

## 2024-05-03 DIAGNOSIS — D62 Acute posthemorrhagic anemia: Secondary | ICD-10-CM | POA: Diagnosis not present

## 2024-05-03 DIAGNOSIS — O1414 Severe pre-eclampsia complicating childbirth: Principal | ICD-10-CM | POA: Diagnosis present

## 2024-05-03 DIAGNOSIS — O26893 Other specified pregnancy related conditions, third trimester: Secondary | ICD-10-CM | POA: Diagnosis present

## 2024-05-03 DIAGNOSIS — Z3A34 34 weeks gestation of pregnancy: Secondary | ICD-10-CM

## 2024-05-03 LAB — URINALYSIS, ROUTINE W REFLEX MICROSCOPIC
Bilirubin Urine: NEGATIVE
Glucose, UA: NEGATIVE mg/dL
Hgb urine dipstick: NEGATIVE
Ketones, ur: NEGATIVE mg/dL
Leukocytes,Ua: NEGATIVE
Nitrite: NEGATIVE
Protein, ur: 30 mg/dL — AB
Specific Gravity, Urine: 1.006 (ref 1.005–1.030)
pH: 6 (ref 5.0–8.0)

## 2024-05-03 MED ORDER — LABETALOL HCL 5 MG/ML IV SOLN: 40.0000 mg | INTRAVENOUS | Status: DC | PRN

## 2024-05-03 MED ORDER — LABETALOL HCL 5 MG/ML IV SOLN: 80.0000 mg | INTRAVENOUS | Status: DC | PRN

## 2024-05-03 MED ORDER — LABETALOL HCL 5 MG/ML IV SOLN
20.0000 mg | INTRAVENOUS | Status: DC | PRN
  Administered 2024-05-04: 20 mg via INTRAVENOUS
  Filled 2024-05-03: qty 4

## 2024-05-03 MED ORDER — HYDRALAZINE HCL 20 MG/ML IJ SOLN: 10.0000 mg | INTRAMUSCULAR | Status: DC | PRN

## 2024-05-03 NOTE — MAU Provider Note (Incomplete)
 Chief Complaint:  Headache and Hypertension   HPI   None     Connie Chapman is a 29 y.o. G1P0000 at [redacted]w[redacted]d who presents to maternity admissions reporting ***.   Pregnancy Course: ***  Past Medical History:  Diagnosis Date   ADD (attention deficit disorder with hyperactivity)    Asthma    Endometriosis    Seasonal allergies    Wears glasses    Goes to [facility]   Wears glasses    OB History  Gravida Para Term Preterm AB Living  1 0 0 0 0 0  SAB IAB Ectopic Multiple Live Births  0 0 0 0 0    # Outcome Date GA Lbr Len/2nd Weight Sex Type Anes PTL Lv  1 Current            Past Surgical History:  Procedure Laterality Date   ADENOIDECTOMY     TONSILLECTOMY  2007   TYMPANOSTOMY TUBE PLACEMENT  2000, 2004   TYMPANOSTOMY TUBE PLACEMENT     Family History  Problem Relation Age of Onset   Hearing loss Mother    Scoliosis Mother    Endometriosis Mother    Hypertension Mother    Other Mother        Tumor Cerebri   Hyperlipidemia Father    Sleep apnea Father    Hypertension Other    Thyroid disease Other    Heart attack Other    Social History[1] Allergies[2] Medications Prior to Admission  Medication Sig Dispense Refill Last Dose/Taking   acetaminophen  (TYLENOL ) 325 MG tablet Take 650 mg by mouth every 6 (six) hours as needed for headache.   05/03/2024 at  6:30 PM   NIFEdipine  (PROCARDIA  XL) 30 MG 24 hr tablet Take 1 tablet (30 mg total) by mouth daily. 30 tablet 0 05/03/2024 at 11:00 AM   Prenatal Vit-Fe Fumarate-FA (PRENATAL VITAMIN PO) Take 1 tablet by mouth daily.   05/03/2024   albuterol  (VENTOLIN  HFA) 108 (90 Base) MCG/ACT inhaler Inhale 1-2 puffs into the lungs every 6 (six) hours as needed. 8 g 0    Blood Pressure Monitoring (BLOOD PRESSURE KIT) DEVI 1 Device by Does not apply route once a week. 1 each 0    fluticasone  (FLOVENT  HFA) 110 MCG/ACT inhaler Inhale 2 puffs into the lungs in the morning and at bedtime. 12 g 1     I have reviewed patient's Past  Medical Hx, Surgical Hx, Family Hx, Social Hx, medications and allergies.   ROS  Pertinent items noted in HPI and remainder of comprehensive ROS otherwise negative.   PHYSICAL EXAM  Patient Vitals for the past 24 hrs:  BP Temp Temp src Pulse Resp SpO2 Height Weight  05/03/24 2317 (!) 166/101 98.3 F (36.8 C) Oral (!) 132 16 97 % 5' 5 (1.651 m) 102.2 kg    Physical Exam      Fetal Tracing: Baseline: *** Variability: Accelerations:  Decelerations: Toco:    Labs: No results found for this or any previous visit (from the past 24 hours).  Imaging:  No results found.  MDM & MAU COURSE  MDM:  MAU Course: Orders Placed This Encounter  Procedures   Urinalysis, Routine w reflex microscopic -Urine, Clean Catch   Protein / creatinine ratio, urine   Comprehensive metabolic panel   CBC with Differential/Platelet   Notify physician (specify) Confirmatory reading of BP> 160/110 15 minutes later   Apply Hypertensive Disorders of Pregnancy Care Plan   Vital signs  Measure blood pressure   Meds ordered this encounter  Medications   AND Linked Order Group    labetalol  (NORMODYNE ) injection 20 mg    labetalol  (NORMODYNE ) injection 40 mg    labetalol  (NORMODYNE ) injection 80 mg    hydrALAZINE  (APRESOLINE ) injection 10 mg    ASSESSMENT  No diagnosis found.  PLAN  Discharge home in stable condition.  ***    Allergies as of 05/03/2024       Reactions   Codeine  Nausea And Vomiting     Med Rec must be completed prior to using this SMARTLINK***       Camie Rote, MSN, CNM 05/03/2024 11:26 PM  Certified Nurse Midwife, [facility]        [1]  Social History Tobacco Use   Smoking status: Never    Passive exposure: Yes   Smokeless tobacco: Never  Vaping Use   Vaping status: Former   Start date: 10/10/2017   Substances: Nicotine, Flavoring  Substance Use Topics   Alcohol use: Not Currently    Comment: occ   Drug use: Never  [2]   Allergies Allergen Reactions   Codeine  Nausea And Vomiting

## 2024-05-03 NOTE — MAU Note (Signed)
 Connie Chapman is a 29 y.o. at [redacted]w[redacted]d here in MAU reporting: HA - tried tylenol  2-3 hours ago - went away from 30 minutes, but came back and feels worse. Checked BP - 157/105. I feel awful. Denies visual disturbances, epigastric pain, VB, LOF, or abdominal pain. Reports after HA started back baby has been moving less - no movement on the way to the hospital. States baby is usually most active at night time. Dx of pre-e. Last dose of procardia  at 1100  Pain score: 7 Vitals:   05/03/24 2317  BP: (!) 166/101  Pulse: (!) 132  Resp: 16  Temp: 98.3 F (36.8 C)  SpO2: 97%     FHT: 158  Lab orders placed from triage: UA

## 2024-05-04 ENCOUNTER — Encounter (HOSPITAL_COMMUNITY): Admission: AD | Disposition: A | Payer: Self-pay | Source: Home / Self Care | Attending: Obstetrics & Gynecology

## 2024-05-04 ENCOUNTER — Encounter (HOSPITAL_COMMUNITY): Payer: Self-pay

## 2024-05-04 ENCOUNTER — Inpatient Hospital Stay (HOSPITAL_COMMUNITY): Admitting: Anesthesiology

## 2024-05-04 ENCOUNTER — Encounter (HOSPITAL_COMMUNITY): Payer: Self-pay | Admitting: Obstetrics and Gynecology

## 2024-05-04 DIAGNOSIS — Z3A34 34 weeks gestation of pregnancy: Secondary | ICD-10-CM | POA: Diagnosis not present

## 2024-05-04 DIAGNOSIS — R519 Headache, unspecified: Secondary | ICD-10-CM | POA: Diagnosis present

## 2024-05-04 DIAGNOSIS — O26893 Other specified pregnancy related conditions, third trimester: Secondary | ICD-10-CM | POA: Diagnosis present

## 2024-05-04 DIAGNOSIS — O99214 Obesity complicating childbirth: Secondary | ICD-10-CM | POA: Diagnosis present

## 2024-05-04 DIAGNOSIS — Z8249 Family history of ischemic heart disease and other diseases of the circulatory system: Secondary | ICD-10-CM | POA: Diagnosis not present

## 2024-05-04 DIAGNOSIS — O1414 Severe pre-eclampsia complicating childbirth: Secondary | ICD-10-CM | POA: Diagnosis present

## 2024-05-04 DIAGNOSIS — Z79899 Other long term (current) drug therapy: Secondary | ICD-10-CM | POA: Diagnosis not present

## 2024-05-04 DIAGNOSIS — O3663X0 Maternal care for excessive fetal growth, third trimester, not applicable or unspecified: Secondary | ICD-10-CM | POA: Diagnosis present

## 2024-05-04 DIAGNOSIS — D62 Acute posthemorrhagic anemia: Secondary | ICD-10-CM | POA: Diagnosis not present

## 2024-05-04 DIAGNOSIS — Z6791 Unspecified blood type, Rh negative: Secondary | ICD-10-CM | POA: Diagnosis not present

## 2024-05-04 DIAGNOSIS — O321XX0 Maternal care for breech presentation, not applicable or unspecified: Secondary | ICD-10-CM

## 2024-05-04 DIAGNOSIS — Z7951 Long term (current) use of inhaled steroids: Secondary | ICD-10-CM | POA: Diagnosis not present

## 2024-05-04 DIAGNOSIS — O1413 Severe pre-eclampsia, third trimester: Principal | ICD-10-CM | POA: Diagnosis present

## 2024-05-04 DIAGNOSIS — O9081 Anemia of the puerperium: Secondary | ICD-10-CM | POA: Diagnosis not present

## 2024-05-04 DIAGNOSIS — Z87891 Personal history of nicotine dependence: Secondary | ICD-10-CM | POA: Diagnosis not present

## 2024-05-04 LAB — COMPREHENSIVE METABOLIC PANEL WITH GFR
ALT: 15 U/L (ref 0–44)
ALT: 11 U/L (ref 0–44)
AST: 27 U/L (ref 15–41)
AST: 21 U/L (ref 15–41)
Albumin: 3.4 g/dL — ABNORMAL LOW (ref 3.5–5.0)
Albumin: 3.1 g/dL — ABNORMAL LOW (ref 3.5–5.0)
Alkaline Phosphatase: 127 U/L — ABNORMAL HIGH (ref 38–126)
Alkaline Phosphatase: 113 U/L (ref 38–126)
Anion gap: 14 (ref 5–15)
Anion gap: 11 (ref 5–15)
BUN: 7 mg/dL (ref 6–20)
BUN: 5 mg/dL — ABNORMAL LOW (ref 6–20)
CO2: 20 mmol/L — ABNORMAL LOW (ref 22–32)
CO2: 23 mmol/L (ref 22–32)
Calcium: 9 mg/dL (ref 8.9–10.3)
Calcium: 7.9 mg/dL — ABNORMAL LOW (ref 8.9–10.3)
Chloride: 101 mmol/L (ref 98–111)
Chloride: 101 mmol/L (ref 98–111)
Creatinine, Ser: 0.53 mg/dL (ref 0.44–1.00)
Creatinine, Ser: 0.54 mg/dL (ref 0.44–1.00)
GFR, Estimated: >60 mL/min
GFR, Estimated: >60 mL/min
Glucose, Bld: 119 mg/dL — ABNORMAL HIGH (ref 70–99)
Glucose, Bld: 148 mg/dL — ABNORMAL HIGH (ref 70–99)
Potassium: 3.8 mmol/L (ref 3.5–5.1)
Potassium: 4.1 mmol/L (ref 3.5–5.1)
Sodium: 135 mmol/L (ref 135–145)
Sodium: 135 mmol/L (ref 135–145)
Total Bilirubin: 0.2 mg/dL (ref 0.0–1.2)
Total Bilirubin: <0.2 mg/dL (ref 0.0–1.2)
Total Protein: 6.6 g/dL (ref 6.5–8.1)
Total Protein: 5.8 g/dL — ABNORMAL LOW (ref 6.5–8.1)

## 2024-05-04 LAB — CBC WITH DIFFERENTIAL/PLATELET
Abs Immature Granulocytes: 0.07 K/uL (ref 0.00–0.07)
Basophils Absolute: 0.1 K/uL (ref 0.0–0.1)
Basophils Relative: 0 %
Eosinophils Absolute: 0.6 K/uL — ABNORMAL HIGH (ref 0.0–0.5)
Eosinophils Relative: 4 %
HCT: 34.8 % — ABNORMAL LOW (ref 36.0–46.0)
Hemoglobin: 12.1 g/dL (ref 12.0–15.0)
Immature Granulocytes: 1 %
Lymphocytes Relative: 18 %
Lymphs Abs: 2.4 K/uL (ref 0.7–4.0)
MCH: 30.1 pg (ref 26.0–34.0)
MCHC: 34.8 g/dL (ref 30.0–36.0)
MCV: 86.6 fL (ref 80.0–100.0)
Monocytes Absolute: 0.9 K/uL (ref 0.1–1.0)
Monocytes Relative: 6 %
Neutro Abs: 9.6 K/uL — ABNORMAL HIGH (ref 1.7–7.7)
Neutrophils Relative %: 71 %
Platelets: 265 K/uL (ref 150–400)
RBC: 4.02 MIL/uL (ref 3.87–5.11)
RDW: 13.5 % (ref 11.5–15.5)
WBC: 13.5 K/uL — ABNORMAL HIGH (ref 4.0–10.5)
nRBC: 0 % (ref 0.0–0.2)

## 2024-05-04 LAB — SYPHILIS: RPR W/REFLEX TO RPR TITER AND TREPONEMAL ANTIBODIES, TRADITIONAL SCREENING AND DIAGNOSIS ALGORITHM: RPR Ser Ql: NONREACTIVE

## 2024-05-04 LAB — PROTEIN / CREATININE RATIO, URINE
Creatinine, Urine: 29 mg/dL
Protein Creatinine Ratio: 1.1 mg/mg — ABNORMAL HIGH
Total Protein, Urine: 31 mg/dL

## 2024-05-04 LAB — CBC
HCT: 30.4 % — ABNORMAL LOW (ref 36.0–46.0)
Hemoglobin: 10.2 g/dL — ABNORMAL LOW (ref 12.0–15.0)
MCH: 29.5 pg (ref 26.0–34.0)
MCHC: 33.6 g/dL (ref 30.0–36.0)
MCV: 87.9 fL (ref 80.0–100.0)
Platelets: 241 K/uL (ref 150–400)
RBC: 3.46 MIL/uL — ABNORMAL LOW (ref 3.87–5.11)
RDW: 13.3 % (ref 11.5–15.5)
WBC: 20.9 K/uL — ABNORMAL HIGH (ref 4.0–10.5)
nRBC: 0 % (ref 0.0–0.2)

## 2024-05-04 LAB — TYPE AND SCREEN
ABO/RH(D): O NEG
Antibody Screen: POSITIVE

## 2024-05-04 LAB — MAGNESIUM: Magnesium: 4.6 mg/dL — ABNORMAL HIGH (ref 1.7–2.4)

## 2024-05-04 MED ORDER — SIMETHICONE 80 MG PO CHEW
80.0000 mg | CHEWABLE_TABLET | Freq: Three times a day (TID) | ORAL | Status: DC
  Administered 2024-05-04 – 2024-05-07 (×9): 80 mg via ORAL
  Filled 2024-05-04 (×9): qty 1

## 2024-05-04 MED ORDER — DIPHENHYDRAMINE HCL 25 MG PO CAPS
25.0000 mg | ORAL_CAPSULE | Freq: Four times a day (QID) | ORAL | Status: DC | PRN
  Administered 2024-05-04: 25 mg via ORAL
  Filled 2024-05-04: qty 1

## 2024-05-04 MED ORDER — KETOROLAC TROMETHAMINE 30 MG/ML IJ SOLN
INTRAMUSCULAR | Status: AC
  Filled 2024-05-04: qty 1

## 2024-05-04 MED ORDER — CEFAZOLIN SODIUM-DEXTROSE 2-3 GM-%(50ML) IV SOLR
INTRAVENOUS | Status: DC | PRN
  Administered 2024-05-04: 2 g via INTRAVENOUS

## 2024-05-04 MED ORDER — ALBUTEROL SULFATE (2.5 MG/3ML) 0.083% IN NEBU: 2.5000 mg | INHALATION_SOLUTION | Freq: Four times a day (QID) | RESPIRATORY_TRACT | Status: DC | PRN

## 2024-05-04 MED ORDER — BUPIVACAINE IN DEXTROSE 0.75-8.25 % IT SOLN
INTRATHECAL | Status: DC | PRN
  Administered 2024-05-04: 1.5 mL via INTRATHECAL

## 2024-05-04 MED ORDER — DEXAMETHASONE SOD PHOSPHATE PF 10 MG/ML IJ SOLN
INTRAMUSCULAR | Status: AC
  Filled 2024-05-04: qty 1

## 2024-05-04 MED ORDER — OXYCODONE HCL 5 MG/5ML PO SOLN: 5.0000 mg | Freq: Once | ORAL | Status: DC | PRN

## 2024-05-04 MED ORDER — MENTHOL 3 MG MT LOZG: 1.0000 | LOZENGE | OROMUCOSAL | Status: DC | PRN

## 2024-05-04 MED ORDER — SENNOSIDES-DOCUSATE SODIUM 8.6-50 MG PO TABS
2.0000 | ORAL_TABLET | Freq: Every day | ORAL | Status: DC
  Administered 2024-05-05 – 2024-05-07 (×3): 2 via ORAL
  Filled 2024-05-04 (×3): qty 2

## 2024-05-04 MED ORDER — BETAMETHASONE SOD PHOS & ACET 6 (3-3) MG/ML IJ SUSP
12.0000 mg | Freq: Once | INTRAMUSCULAR | Status: AC
  Administered 2024-05-04: 12 mg via INTRAMUSCULAR
  Filled 2024-05-04: qty 5

## 2024-05-04 MED ORDER — EPHEDRINE SULFATE-NACL 50-0.9 MG/10ML-% IV SOSY
PREFILLED_SYRINGE | INTRAVENOUS | Status: DC | PRN
  Administered 2024-05-04: 10 mg via INTRAVENOUS

## 2024-05-04 MED ORDER — POTASSIUM CHLORIDE CRYS ER 20 MEQ PO TBCR
20.0000 meq | EXTENDED_RELEASE_TABLET | Freq: Every day | ORAL | Status: DC
  Administered 2024-05-04 – 2024-05-07 (×4): 20 meq via ORAL
  Filled 2024-05-04 (×4): qty 1

## 2024-05-04 MED ORDER — PRENATAL MULTIVITAMIN CH
1.0000 | ORAL_TABLET | Freq: Every day | ORAL | Status: DC
  Administered 2024-05-04 – 2024-05-07 (×4): 1 via ORAL
  Filled 2024-05-04 (×4): qty 1

## 2024-05-04 MED ORDER — HYDROMORPHONE HCL 1 MG/ML IJ SOLN
0.2500 mg | INTRAMUSCULAR | Status: DC | PRN
  Administered 2024-05-04: 0.5 mg via INTRAVENOUS

## 2024-05-04 MED ORDER — OXYTOCIN-SODIUM CHLORIDE 30-0.9 UT/500ML-% IV SOLN
INTRAVENOUS | Status: DC | PRN
  Administered 2024-05-04: 300 mL via INTRAVENOUS
  Administered 2024-05-04: 41.7 mL via INTRAVENOUS

## 2024-05-04 MED ORDER — WITCH HAZEL-GLYCERIN EX PADS: 1.0000 | MEDICATED_PAD | CUTANEOUS | Status: DC | PRN

## 2024-05-04 MED ORDER — CEFAZOLIN SODIUM-DEXTROSE 2-4 GM/100ML-% IV SOLN
2.0000 g | INTRAVENOUS | Status: DC
  Filled 2024-05-04: qty 100

## 2024-05-04 MED ORDER — STERILE WATER FOR IRRIGATION IR SOLN
Status: DC | PRN
  Administered 2024-05-04: 1000 mL

## 2024-05-04 MED ORDER — IBUPROFEN 600 MG PO TABS
600.0000 mg | ORAL_TABLET | Freq: Four times a day (QID) | ORAL | Status: DC
  Administered 2024-05-05 – 2024-05-07 (×9): 600 mg via ORAL
  Filled 2024-05-04 (×9): qty 1

## 2024-05-04 MED ORDER — ONDANSETRON HCL 4 MG/2ML IJ SOLN
INTRAMUSCULAR | Status: DC | PRN
  Administered 2024-05-04: 4 mg via INTRAVENOUS

## 2024-05-04 MED ORDER — LACTATED RINGERS IV SOLN: INTRAVENOUS | Status: DC | PRN

## 2024-05-04 MED ORDER — COCONUT OIL OIL: 1.0000 | TOPICAL_OIL | Status: DC | PRN

## 2024-05-04 MED ORDER — ZOLPIDEM TARTRATE 5 MG PO TABS: 5.0000 mg | ORAL_TABLET | Freq: Every evening | ORAL | Status: DC | PRN

## 2024-05-04 MED ORDER — MEPERIDINE HCL 25 MG/ML IJ SOLN: 6.2500 mg | INTRAMUSCULAR | Status: DC | PRN

## 2024-05-04 MED ORDER — MAGNESIUM SULFATE BOLUS VIA INFUSION
4.0000 g | Freq: Once | INTRAVENOUS | Status: AC
  Administered 2024-05-04: 4 g via INTRAVENOUS
  Filled 2024-05-04: qty 1000

## 2024-05-04 MED ORDER — MORPHINE SULFATE (PF) 0.5 MG/ML IJ SOLN
INTRAMUSCULAR | Status: DC | PRN
  Administered 2024-05-04: 150 ug via INTRATHECAL

## 2024-05-04 MED ORDER — PRENATAL MULTIVITAMIN CH: 1.0000 | ORAL_TABLET | Freq: Every day | ORAL | Status: DC

## 2024-05-04 MED ORDER — LACTATED RINGERS IV SOLN: 125.0000 mL/h | INTRAVENOUS | Status: DC

## 2024-05-04 MED ORDER — LACTATED RINGERS IV SOLN
INTRAVENOUS | Status: AC
  Administered 2024-05-04: 75 mL/h via INTRAVENOUS

## 2024-05-04 MED ORDER — ACETAMINOPHEN 500 MG PO TABS
1000.0000 mg | ORAL_TABLET | Freq: Four times a day (QID) | ORAL | Status: DC
  Administered 2024-05-04 – 2024-05-07 (×12): 1000 mg via ORAL
  Filled 2024-05-04 (×13): qty 2

## 2024-05-04 MED ORDER — PHENYLEPHRINE 80 MCG/ML (10ML) SYRINGE FOR IV PUSH (FOR BLOOD PRESSURE SUPPORT)
PREFILLED_SYRINGE | INTRAVENOUS | Status: AC
  Filled 2024-05-04: qty 10

## 2024-05-04 MED ORDER — FENTANYL CITRATE (PF) 100 MCG/2ML IJ SOLN
INTRAMUSCULAR | Status: AC
  Filled 2024-05-04: qty 2

## 2024-05-04 MED ORDER — SIMETHICONE 80 MG PO CHEW: 80.0000 mg | CHEWABLE_TABLET | ORAL | Status: DC | PRN

## 2024-05-04 MED ORDER — HYDROMORPHONE HCL 1 MG/ML IJ SOLN
0.2000 mg | INTRAMUSCULAR | Status: DC | PRN
  Administered 2024-05-04: 0.5 mg via INTRAVENOUS
  Filled 2024-05-04: qty 1

## 2024-05-04 MED ORDER — KETOROLAC TROMETHAMINE 30 MG/ML IJ SOLN
30.0000 mg | Freq: Four times a day (QID) | INTRAMUSCULAR | Status: AC
  Administered 2024-05-04 – 2024-05-05 (×4): 30 mg via INTRAVENOUS
  Filled 2024-05-04 (×4): qty 1

## 2024-05-04 MED ORDER — AMISULPRIDE (ANTIEMETIC) 5 MG/2ML IV SOLN: 10.0000 mg | Freq: Once | INTRAVENOUS | Status: DC | PRN

## 2024-05-04 MED ORDER — PHENYLEPHRINE 80 MCG/ML (10ML) SYRINGE FOR IV PUSH (FOR BLOOD PRESSURE SUPPORT)
PREFILLED_SYRINGE | INTRAVENOUS | Status: DC | PRN
  Administered 2024-05-04: 80 ug via INTRAVENOUS
  Administered 2024-05-04: 160 ug via INTRAVENOUS

## 2024-05-04 MED ORDER — FUROSEMIDE 20 MG PO TABS
20.0000 mg | ORAL_TABLET | Freq: Every day | ORAL | Status: DC
  Administered 2024-05-04 – 2024-05-07 (×4): 20 mg via ORAL
  Filled 2024-05-04 (×4): qty 1

## 2024-05-04 MED ORDER — OXYCODONE HCL 5 MG PO TABS: 5.0000 mg | ORAL_TABLET | Freq: Once | ORAL | Status: DC | PRN

## 2024-05-04 MED ORDER — ACETAMINOPHEN 10 MG/ML IV SOLN
INTRAVENOUS | Status: DC | PRN
  Administered 2024-05-04: 1000 mg via INTRAVENOUS

## 2024-05-04 MED ORDER — FENTANYL CITRATE (PF) 100 MCG/2ML IJ SOLN
INTRAMUSCULAR | Status: DC | PRN
  Administered 2024-05-04: 15 ug via INTRATHECAL

## 2024-05-04 MED ORDER — SODIUM CHLORIDE 0.9 % IR SOLN
Status: DC | PRN
  Administered 2024-05-04: 1000 mL

## 2024-05-04 MED ORDER — SOD CITRATE-CITRIC ACID 500-334 MG/5ML PO SOLN
30.0000 mL | ORAL | Status: AC
  Administered 2024-05-04: 30 mL via ORAL
  Filled 2024-05-04: qty 30

## 2024-05-04 MED ORDER — ENOXAPARIN SODIUM 60 MG/0.6ML IJ SOSY
50.0000 mg | PREFILLED_SYRINGE | INTRAMUSCULAR | Status: DC
  Administered 2024-05-04 – 2024-05-06 (×3): 50 mg via SUBCUTANEOUS
  Filled 2024-05-04 (×3): qty 0.6

## 2024-05-04 MED ORDER — DEXAMETHASONE SOD PHOSPHATE PF 10 MG/ML IJ SOLN
INTRAMUSCULAR | Status: DC | PRN
  Administered 2024-05-04: 10 mg via INTRAVENOUS

## 2024-05-04 MED ORDER — KETOROLAC TROMETHAMINE 30 MG/ML IJ SOLN
30.0000 mg | Freq: Once | INTRAMUSCULAR | Status: AC | PRN
  Administered 2024-05-04: 30 mg via INTRAVENOUS

## 2024-05-04 MED ORDER — ACETAMINOPHEN 325 MG PO TABS: 650.0000 mg | ORAL_TABLET | ORAL | Status: DC | PRN

## 2024-05-04 MED ORDER — CYCLOBENZAPRINE HCL 10 MG PO TABS
10.0000 mg | ORAL_TABLET | Freq: Three times a day (TID) | ORAL | Status: DC | PRN
  Administered 2024-05-04: 10 mg via ORAL
  Filled 2024-05-04: qty 1

## 2024-05-04 MED ORDER — MORPHINE SULFATE (PF) 0.5 MG/ML IJ SOLN
INTRAMUSCULAR | Status: AC
  Filled 2024-05-04: qty 10

## 2024-05-04 MED ORDER — OXYTOCIN-SODIUM CHLORIDE 30-0.9 UT/500ML-% IV SOLN: 2.5000 [IU]/h | INTRAVENOUS | Status: AC

## 2024-05-04 MED ORDER — AZITHROMYCIN 500 MG PO TABS
1000.0000 mg | ORAL_TABLET | Freq: Once | ORAL | Status: AC
  Administered 2024-05-04: 1000 mg via ORAL
  Filled 2024-05-04: qty 2

## 2024-05-04 MED ORDER — PHENYLEPHRINE HCL-NACL 20-0.9 MG/250ML-% IV SOLN
INTRAVENOUS | Status: DC | PRN
  Administered 2024-05-04: 60 ug/min via INTRAVENOUS

## 2024-05-04 MED ORDER — OXYCODONE HCL 5 MG PO TABS
5.0000 mg | ORAL_TABLET | ORAL | Status: DC | PRN
  Administered 2024-05-04 – 2024-05-07 (×8): 10 mg via ORAL
  Filled 2024-05-04 (×8): qty 2

## 2024-05-04 MED ORDER — HYDROMORPHONE HCL 1 MG/ML IJ SOLN
INTRAMUSCULAR | Status: AC
  Filled 2024-05-04: qty 0.5

## 2024-05-04 MED ORDER — MAGNESIUM SULFATE 40 GM/1000ML IV SOLN
2.0000 g/h | INTRAVENOUS | Status: AC
  Administered 2024-05-04 (×3): 2 g/h via INTRAVENOUS
  Filled 2024-05-04 (×2): qty 1000

## 2024-05-04 MED ORDER — EPHEDRINE 5 MG/ML INJ
INTRAVENOUS | Status: AC
  Filled 2024-05-04: qty 5

## 2024-05-04 MED ORDER — ONDANSETRON HCL 4 MG/2ML IJ SOLN
INTRAMUSCULAR | Status: AC
  Filled 2024-05-04: qty 2

## 2024-05-04 MED ORDER — ONDANSETRON HCL 4 MG/2ML IJ SOLN: 4.0000 mg | Freq: Once | INTRAMUSCULAR | Status: DC | PRN

## 2024-05-04 MED ORDER — BUDESONIDE 0.25 MG/2ML IN SUSP
0.2500 mg | Freq: Two times a day (BID) | RESPIRATORY_TRACT | Status: DC
  Administered 2024-05-05: 0.25 mg via RESPIRATORY_TRACT
  Filled 2024-05-04 (×6): qty 2

## 2024-05-04 MED ORDER — GABAPENTIN 100 MG PO CAPS
100.0000 mg | ORAL_CAPSULE | Freq: Two times a day (BID) | ORAL | Status: DC
  Administered 2024-05-04 – 2024-05-07 (×7): 100 mg via ORAL
  Filled 2024-05-04 (×7): qty 1

## 2024-05-04 MED ORDER — DIBUCAINE (PERIANAL) 1 % EX OINT: 1.0000 | TOPICAL_OINTMENT | CUTANEOUS | Status: DC | PRN

## 2024-05-04 MED ORDER — NIFEDIPINE ER OSMOTIC RELEASE 30 MG PO TB24
30.0000 mg | ORAL_TABLET | Freq: Every day | ORAL | Status: DC
  Administered 2024-05-04: 30 mg via ORAL
  Filled 2024-05-04: qty 1

## 2024-05-04 NOTE — Op Note (Signed)
 Connie Chapman PROCEDURE DATE: 05/04/2024  PREOPERATIVE DIAGNOSES: Intrauterine pregnancy at [redacted]w[redacted]d weeks gestation; malpresentation: breech and severe preeclampsia  POSTOPERATIVE DIAGNOSES: The same  PROCEDURE: Primary Low Transverse Cesarean Section  SURGEON:  Dr. Vina Chapman  ASSISTANT: Dr. Garen Chapman, An experienced assistant was required given the standard of surgical care given the complexity of the case.  This assistant was needed for exposure, dissection, suctioning, retraction, instrument exchange, assisting with delivery with administration of fundal pressure, and for overall help during the procedure.  ANESTHESIOLOGY TEAM: Anesthesiologist: Connie Almarie HERO, DO CRNA: Connie Asberry BIRCH, CRNA  INDICATIONS: Connie Chapman is a 29 y.o. G1P0000 at [redacted]w[redacted]d here for cesarean section secondary to the indications listed under preoperative diagnoses; please see preoperative note for further details.  The risks of surgery were discussed with the patient including but were not limited to: bleeding which may require transfusion or reoperation; infection which may require antibiotics; injury to bowel, bladder, ureters or other surrounding organs; injury to the fetus; need for additional procedures including hysterectomy in the event of a life-threatening hemorrhage; formation of adhesions; placental abnormalities wth subsequent pregnancies; incisional problems; thromboembolic phenomenon and other postoperative/anesthesia complications.  The patient concurred with the proposed plan, giving informed written consent for the procedure.    FINDINGS:  Viable female infant in breech presentation.  Amniotic fluid: clear.  Intact placenta, three vessel cord.  Normal uterus, fallopian tubes and ovaries bilaterally.  ANESTHESIA: spinal INTRAVENOUS FLUIDS: 1000 ml   ESTIMATED BLOOD LOSS: 293 ml URINE OUTPUT:  100 ml SPECIMENS: Placenta sent to pathology  COMPLICATIONS: None  immediate  PROCEDURE IN DETAIL:  The patient preoperatively received intravenous antibiotics and had sequential compression devices applied to her lower extremities.  She was then taken to the operating room where spinal anesthesia was found to be adequate. She was then placed in a dorsal supine position with a leftward tilt, and prepped and draped in a sterile manner.  A foley catheter was  placed into her bladder and attached to constant gravity.  After an adequate timeout was performed, a Pfannenstiel skin incision was made with scalpel and carried through to the underlying layer of fascia. The fascia was incised in the midline, and this incision was extended bluntly. The rectus muscles were separated in the midline and the peritoneum was entered bluntly.   The Alexis self-retaining retractor was introduced into the abdominal cavity.  Attention was turned to the lower uterine segment where a low transverse hysterotomy was made with a scalpel and extended bilaterally bluntly.  The infant was successfully delivered, the cord was clamped and cut after one minute, and the infant was handed over to the awaiting neonatology team. Uterine massage was then administered, and the placenta delivered intact with a three-vessel cord. The uterus was then cleared of clots and debris.  The hysterotomy was closed with 0 Vicryl in a running fashion.  One figure-of-eight 0 Vicryl serosal stitches were placed to help with hemostasis.    The pelvis was cleared of all clot and debris. Hemostasis was confirmed on all surfaces.  The retractor was removed.  The peritoneum was closed with a 2-0 Vicryl running stitch. The fascia was then closed using 0 Vicryl in a running fashion.  The subcutaneous layer was irrigated, any areas of bleeding were cauterized with the bovie and was reapproximated with 2-0 plain gut in a running fashion. The skin was closed with a 4-0 monocryl subcuticular stitch. The patient tolerated the procedure  well. Sponge, instrument and  needle counts were correct x 3.  She was taken to the recovery room in stable condition.   Connie Solian, MD Attending Obstetrician & Gynecologist, [facility] for [facility], [facility]

## 2024-05-04 NOTE — Lactation Note (Signed)
 This note was copied from a baby's chart.  NICU Lactation Consultation Note  Patient Name: Connie Chapman Date: 05/04/2024 Age:28 hours  Reason for consult: Initial assessment; Primapara; 1st time breastfeeding; NICU baby; Late-preterm 34-36.6wks; Infant < 6lbs; Other (Comment) (CHTN, pre-eclampsia)  SUBJECTIVE  LC in to visit with P1 Mom of baby Connie Chapman born by C/Section at 34wk for pre-eclampsia.  Baby is currently on room air and receiving scheduled gavage feedings.  Mom's desire is to direct breastfeed and pumping and bottle feed baby.  Encouraged STS when able to in the NICU.  Mom desires to initiate her breast pumping.  Mom able to collect some drops which LC placed in collection bottle and placed in frig.  Mom knows to take milk to NICU and ask for milk labels to record the date and time of expression.  Mom very tired as she is on magnesium  sulfate, so education will need to be repeated.  LC set up washing and drying basins and educated Mom on the importance of this.  LC washed the flanges and colostrum protectors after first use.  Encouraged Mom to do the best she can, with a goal of 8 times per 24 hrs.  OBJECTIVE Infant data: Mother's Current Feeding Choice: Breast Milk and Donor Milk  O2 Device: Room Air  Infant feeding assessment IDFS - Readiness: 3   Maternal data: G1P0101 C-Section, Low Transverse Has patient been taught Hand Expression?: Yes Hand Expression Comments: colostrum drop noted Significant Breast History:: ++ breast changes Current breast feeding challenges:: LPTI admitted to NICU Does the patient have breastfeeding experience prior to this delivery?: No Pumping frequency: Initiated pumping at 5 hrs post partum, encouraged to pump 8 times per 24 hrs Pumped volume: 1 mL Flange Size: 18 Hands-free pumping top sizes: Large Alejos) Risk factor for low/delayed milk supply:: Infant separation  WIC Program: Yes WIC Referral Sent?: Yes What  county?: [place] Pump: Hands Free, Personal (hands free pump through insurance)  ASSESSMENT Infant:  Feeding Status: Scheduled 9-12-3-6 Feeding method: Tube/Gavage (Bolus)  Maternal: Milk volume: Normal  INTERVENTIONS/PLAN Interventions: Interventions: Breast feeding basics reviewed; Skin to skin; Breast massage; Hand express; Coconut oil; DEBP; Hand pump; Education; [employer] Services brochure; CDC Guidelines for Breast Pump Cleaning; NICU Pumping Log Tools: Pump; Flanges; Hands-free pumping top; Coconut oil Pump Education: Setup, frequency, and cleaning; Milk Storage  Plan: Consult Status: NICU follow-up NICU Follow-up type: New admission follow up   Claudene Aleck BRAVO 05/04/2024, 1:25 PM

## 2024-05-04 NOTE — Discharge Summary (Signed)
 "    Postpartum Discharge Summary     Patient Name: Connie Chapman DOB: [birth year] MRN: [id number]  Date of admission: 05/03/2024 Delivery date:05/04/2024 Delivering provider: CLEATUS MOCCASIN Date of discharge: 05/07/2024  Admitting diagnosis: Pregnancy at 34/1. Severe pre-eclampsia based on neuro s/s.  Additional problems: Breech presentation, maternal obesity, Rh negative, Anxiety/PTSD    Discharge diagnosis: Preterm Pregnancy Delivered and Preeclampsia (severe)                                              Post partum procedures:none Augmentation: N/A Complications: None  Hospital course: Sceduled C/S   29 y.o. yo G1P0101 at [redacted]w[redacted]d was admitted to the hospital 05/03/2024 for scheduled cesarean section with the following indication: Malpresentation with delivery early due to preeclampsia with SF based on BP and headache. Delivery details are as follows:  Membrane Rupture Time/Date: 6:04 AM,05/04/2024  Delivery Method:C-Section, Low Transverse Operative Delivery:N/A Details of operation can be found in separate operative note.  Patient had a postpartum course complicated by severe Preeclampsia. She was treated with Magnesium  both preoperatively and for the 24 hours following delivery. After delivery, for BP control, she was placed on Lasix , potassium and procardia .  She was titrated to normotensive-mild range BP and labetalol  added on the day of delivery.  She is ambulating, tolerating a regular diet, passing flatus, and urinating well. Patient is discharged home in stable condition on  05/07/24        Newborn Data: Birth date:05/04/2024 Birth time:6:04 AM Gender:Female Living status:Living Apgars:8 ,9  Weight:2570 g    Magnesium  Sulfate received: Yes: Seizure prophylaxis BMZ received: Yes x1 Rhophylac :Not needed MMR:N/A T-DaP:Given prenatally Flu: No RSV Vaccine received: No Transfusion:No  Immunizations received: Immunization History  Administered Date(s) Administered   DTP  12/22/1996, 02/14/1997, 04/27/1997, 08/22/1998, 07/19/2001   HIB (PRP-OMP) 12/22/1996, 02/14/1997, 08/22/1998   Hepatitis A, Ped/Adol-2 Dose 09/14/2013   Hepatitis B April 21, 1996, 11/21/1996, 07/27/1997   Hpv-Unspecified 12/22/2007, 02/21/2008, 06/20/2008   Influenza Split 05/14/2011   Influenza Whole 02/07/2010   Influenza,inj,Quad PF,6+ Mos 12/16/2012, 01/27/2016   MMR 11/01/1997, 07/19/2001   Meningococcal Conjugate 09/14/2013   Meningococcal polysaccharide vaccine (MPSV4) 12/22/2007   OPV 12/22/1996, 02/14/1997, 04/27/1997, 07/19/2001   Rotavirus 02/14/1997, 04/27/1997, 07/27/1997   Td 12/22/2007   Tdap 03/22/2024   Varicella 01/02/1998, 09/14/2013    Physical exam  Vitals:   05/06/24 1229 05/06/24 2316 05/07/24 0528 05/07/24 0904  BP: 137/82 (!) 142/70 (!) 142/85 136/84  Pulse: 80 85 85 83  Resp: 18 20 20    Temp: 98 F (36.7 C) 98.2 F (36.8 C)    TempSrc: Oral Oral    SpO2: 100% 96% 98%   Weight:      Height:       General: alert Lochia: appropriate Uterine Fundus: firm, nttp, +BS Incision: Dressing is clean, dry, and intact DVT Evaluation: No evidence of DVT seen on physical exam. Labs: Lab Results  Component Value Date   WBC 19.0 (H) 05/05/2024   HGB 9.6 (L) 05/05/2024   HCT 28.7 (L) 05/05/2024   MCV 88.9 05/05/2024   PLT 238 05/05/2024      Latest Ref Rng & Units 05/05/2024    5:25 AM  CMP  Glucose 70 - 99 mg/dL 884   BUN 6 - 20 mg/dL 7   Creatinine 9.55 - 8.99 mg/dL 9.41   Sodium 864 - 854  mmol/L 134   Potassium 3.5 - 5.1 mmol/L 4.0   Chloride 98 - 111 mmol/L 101   CO2 22 - 32 mmol/L 23   Calcium 8.9 - 10.3 mg/dL 7.1   Total Protein 6.5 - 8.1 g/dL 5.7   Total Bilirubin 0.0 - 1.2 mg/dL <9.7   Alkaline Phos 38 - 126 U/L 106   AST 15 - 41 U/L 19   ALT 0 - 44 U/L 10    Edinburgh Score:    05/05/2024   10:00 AM  Edinburgh Postnatal Depression Scale Screening Tool  I have been able to laugh and see the funny side of things. 0  I have looked  forward with enjoyment to things. 0  I have blamed myself unnecessarily when things went wrong. 2  I have been anxious or worried for no good reason. 0  I have felt scared or panicky for no good reason. 1  Things have been getting on top of me. 1  I have been so unhappy that I have had difficulty sleeping. 0  I have felt sad or miserable. 0  I have been so unhappy that I have been crying. 0  The thought of harming myself has occurred to me. 0  Edinburgh Postnatal Depression Scale Total 4   No data recorded  After visit meds:  Allergies as of 05/07/2024       Reactions   Codeine  Nausea And Vomiting        Medication List     TAKE these medications    Acetaminophen  Extra Strength 500 MG Tabs Take 2 tablets (1,000 mg total) by mouth every 6 (six) hours as needed for moderate pain (pain score 4-6). What changed:  medication strength how much to take reasons to take this   albuterol  108 (90 Base) MCG/ACT inhaler Commonly known as: VENTOLIN  HFA Inhale 1-2 puffs into the lungs every 6 (six) hours as needed.   Blood Pressure Kit Devi 1 Device by Does not apply route once a week.   FeroSul 325 (65 Fe) MG tablet Generic drug: ferrous sulfate  Take 1 tablet by mouth every Monday, Wednesday, and Friday. Start taking on: May 08, 2024   fluticasone  110 MCG/ACT inhaler Commonly known as: FLOVENT  HFA Inhale 2 puffs into the lungs in the morning and at bedtime.   furosemide  20 MG tablet Commonly known as: LASIX  Take 1 tablet (20 mg total) by mouth daily.   ibuprofen  600 MG tablet Commonly known as: ADVIL  Take 1 tablet (600 mg total) by mouth every 6 (six) hours as needed for moderate pain (pain score 4-6).   Incassia  0.35 MG tablet Generic drug: norethindrone  Take 1 tablet (0.35 mg total) by mouth daily.   labetalol  100 MG tablet Commonly known as: NORMODYNE  Take 1 tablet (100 mg total) by mouth 2 (two) times daily.   NIFEdipine  60 MG 24 hr tablet Commonly known  as: ADALAT  CC Take 1 tablet (60 mg total) by mouth daily. What changed:  medication strength how much to take   oxyCODONE  5 MG immediate release tablet Commonly known as: Oxy IR/ROXICODONE  Take 1 tablet (5 mg total) by mouth every 6 (six) hours as needed for severe pain (pain score 7-10).   potassium chloride  SA 20 MEQ tablet Commonly known as: KLOR-CON  M Take 1 tablet (20 mEq total) by mouth daily.   PRENATAL VITAMIN PO Take 1 tablet by mouth daily.   Stool Softener/Laxative 50-8.6 MG tablet Generic drug: senna-docusate Take 2 tablets by mouth at bedtime  as needed for mild constipation or moderate constipation.         Discharge home in stable condition Infant Feeding: Breast Infant Disposition:NICU Discharge instruction: per After Visit Summary and Postpartum booklet. Activity: Advance as tolerated. Pelvic rest for 6 weeks.  Diet: routine diet Future Appointments: Future Appointments  Date Time Provider Department Center  06/15/2024  9:15 AM Abigail, Rollo DASEN, MD [facility] None   Follow up Visit:  Follow-up Information     [facility] for [facility]. Go in 1 week(s).   Specialty: Obstetrics and Gynecology Why: blood pressure check, incision check  call the office on wednesday, 1/28 if you haven't heard about this appointment, Contact information: [address] [place]  [address] [phone]               Message sent to [facility]ina on 1/22 by Dr. Cleatus.   Please schedule this patient for a In person postpartum visit in 6 weeks with the following provider: Any provider. Additional Postpartum F/U:Incision check 1 week and BP check 1 week  High risk pregnancy complicated by: HTN Delivery mode:  C-Section, Low Transverse Anticipated Birth Control:  POPs   05/07/2024 Bebe Furry, MD    "

## 2024-05-04 NOTE — Anesthesia Postprocedure Evaluation (Signed)
"   Anesthesia Post Note  Patient: Connie Chapman  Procedure(s) Performed: CESAREAN DELIVERY     Patient location during evaluation: PACU Anesthesia Type: Spinal Level of consciousness: awake and alert and oriented Pain management: pain level controlled Vital Signs Assessment: post-procedure vital signs reviewed and stable Respiratory status: spontaneous breathing, nonlabored ventilation and respiratory function stable Cardiovascular status: blood pressure returned to baseline and stable Postop Assessment: no headache, no backache, spinal receding and no apparent nausea or vomiting Anesthetic complications: no   No notable events documented.  Last Vitals:  Vitals:   05/04/24 0650 05/04/24 0700  BP: 125/85 110/84  Pulse: 95 93  Resp: (!) 24 16  Temp: 36.5 C   SpO2: 97% 97%    Last Pain:  Vitals:   05/04/24 0700  TempSrc:   PainSc: 0-No pain   Pain Goal:    LLE Motor Response: No movement due to regional block (05/04/24 0700) LLE Sensation: Decreased (05/04/24 0700) RLE Motor Response: No movement due to regional block (05/04/24 0700) RLE Sensation: Decreased (05/04/24 0700)     Epidural/Spinal Function Cutaneous sensation: Tingles (05/04/24 0700), Patient able to flex knees: No (05/04/24 0700), Patient able to lift hips off bed: No (05/04/24 0700), Back pain beyond tenderness at insertion site: No (05/04/24 0700), Progressively worsening motor and/or sensory loss: No (05/04/24 0700), Bowel and/or bladder incontinence post epidural: No (05/04/24 0700)  Almarie HERO Othell Jaime      "

## 2024-05-04 NOTE — Transfer of Care (Signed)
 Immediate Anesthesia Transfer of Care Note  Patient: Connie Chapman  Procedure(s) Performed: CESAREAN DELIVERY  Patient Location: PACU  Anesthesia Type:Spinal  Level of Consciousness: awake, alert , and oriented  Airway & Oxygen Therapy: Patient Spontanous Breathing  Post-op Assessment: Report given to RN and Post -op Vital signs reviewed and stable  Post vital signs: Reviewed and stable  Last Vitals:  Vitals Value Taken Time  BP    Temp    Pulse 99 05/04/24 06:58  Resp 23 05/04/24 06:58  SpO2 95 % 05/04/24 06:58  Vitals shown include unfiled device data.  Last Pain:  Vitals:   05/04/24 0501  TempSrc: Oral  PainSc:          Complications: No notable events documented.

## 2024-05-04 NOTE — Anesthesia Preprocedure Evaluation (Addendum)
"                                    Anesthesia Evaluation  Patient identified by MRN, date of birth, ID band Patient awake    Reviewed: Allergy & Precautions, NPO status , Patient's Chart, lab work & pertinent test results  Airway Mallampati: III  TM Distance: >3 FB Neck ROM: Full    Dental  (+) Teeth Intact, Dental Advisory Given   Pulmonary asthma (uses rescue inhaler every morning)    Pulmonary exam normal breath sounds clear to auscultation       Cardiovascular hypertension (preE SF), Pt. on medications Normal cardiovascular exam Rhythm:Regular Rate:Normal     Neuro/Psych  PSYCHIATRIC DISORDERS Anxiety Depression    negative neurological ROS     GI/Hepatic negative GI ROS, Neg liver ROS,,,  Endo/Other  BMI 37  Renal/GU negative Renal ROS  negative genitourinary   Musculoskeletal negative musculoskeletal ROS (+)    Abdominal  (+) + obese  Peds  Hematology negative hematology ROS (+) Hb 12.1, plt 265   Anesthesia Other Findings   Reproductive/Obstetrics (+) Pregnancy Breech, preE SF 33 weeks                              Anesthesia Physical Anesthesia Plan  ASA: 3  Anesthesia Plan: Spinal   Post-op Pain Management: Regional block, Ofirmev  IV (intra-op)* and Toradol  IV (intra-op)*   Induction:   PONV Risk Score and Plan: 3 and Ondansetron , Dexamethasone  and Treatment may vary due to age or medical condition  Airway Management Planned: Natural Airway and Nasal Cannula  Additional Equipment: None  Intra-op Plan:   Post-operative Plan:   Informed Consent: I have reviewed the patients History and Physical, chart, labs and discussed the procedure including the risks, benefits and alternatives for the proposed anesthesia with the patient or authorized representative who has indicated his/her understanding and acceptance.       Plan Discussed with: CRNA  Anesthesia Plan Comments:          Anesthesia  Quick Evaluation  "
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
## 2024-05-04 NOTE — Anesthesia Procedure Notes (Signed)
 Spinal  Patient location during procedure: OR Start time: 05/04/2024 5:39 AM End time: 05/04/2024 5:44 AM Reason for block: surgical anesthesia  Staffing Performed: anesthesiologist  Authorized by: Merla Almarie HERO, DO   Performed by: Merla Almarie HERO, DO  Preanesthetic Checklist Completed: patient identified, IV checked, risks and benefits discussed, surgical consent, monitors and equipment checked, pre-op evaluation and timeout performed Spinal Block Patient position: sitting Prep: DuraPrep and site prepped and draped Patient monitoring: cardiac monitor, continuous pulse ox and blood pressure Approach: midline Location: L3-4 Injection technique: single-shot Needle Needle type: Pencan  Needle gauge: 24 G Needle length: 9 cm Assessment Sensory level: T6 Events: CSF return  Additional Notes Functioning IV was confirmed and monitors were applied. Sterile prep and drape, including hand hygiene and sterile gloves were used. The patient was positioned and the spine was prepped. The skin was anesthetized with lidocaine.  Free flow of clear CSF was obtained prior to injecting local anesthetic into the CSF.  The spinal needle aspirated freely following injection.  The needle was carefully withdrawn.  The patient tolerated the procedure well.

## 2024-05-05 ENCOUNTER — Other Ambulatory Visit

## 2024-05-05 LAB — CBC
HCT: 28.7 % — ABNORMAL LOW (ref 36.0–46.0)
Hemoglobin: 9.6 g/dL — ABNORMAL LOW (ref 12.0–15.0)
MCH: 29.7 pg (ref 26.0–34.0)
MCHC: 33.4 g/dL (ref 30.0–36.0)
MCV: 88.9 fL (ref 80.0–100.0)
Platelets: 238 K/uL (ref 150–400)
RBC: 3.23 MIL/uL — ABNORMAL LOW (ref 3.87–5.11)
RDW: 13.5 % (ref 11.5–15.5)
WBC: 19 K/uL — ABNORMAL HIGH (ref 4.0–10.5)
nRBC: 0 % (ref 0.0–0.2)

## 2024-05-05 LAB — COMPREHENSIVE METABOLIC PANEL WITH GFR
ALT: 10 U/L (ref 0–44)
AST: 19 U/L (ref 15–41)
Albumin: 3 g/dL — ABNORMAL LOW (ref 3.5–5.0)
Alkaline Phosphatase: 106 U/L (ref 38–126)
Anion gap: 10 (ref 5–15)
BUN: 7 mg/dL (ref 6–20)
CO2: 23 mmol/L (ref 22–32)
Calcium: 7.1 mg/dL — ABNORMAL LOW (ref 8.9–10.3)
Chloride: 101 mmol/L (ref 98–111)
Creatinine, Ser: 0.58 mg/dL (ref 0.44–1.00)
GFR, Estimated: >60 mL/min
Glucose, Bld: 115 mg/dL — ABNORMAL HIGH (ref 70–99)
Potassium: 4 mmol/L (ref 3.5–5.1)
Sodium: 134 mmol/L — ABNORMAL LOW (ref 135–145)
Total Bilirubin: <0.2 mg/dL (ref 0.0–1.2)
Total Protein: 5.7 g/dL — ABNORMAL LOW (ref 6.5–8.1)

## 2024-05-05 LAB — RH IG WORKUP (INCLUDES ABO/RH): Gestational Age(Wks): 34

## 2024-05-05 MED ORDER — BENZOCAINE-MENTHOL 20-0.5 % EX AERO
1.0000 | INHALATION_SPRAY | Freq: Four times a day (QID) | CUTANEOUS | Status: DC | PRN
  Filled 2024-05-05: qty 56

## 2024-05-05 MED ORDER — NIFEDIPINE ER OSMOTIC RELEASE 30 MG PO TB24
60.0000 mg | ORAL_TABLET | Freq: Every day | ORAL | Status: DC
  Administered 2024-05-05 – 2024-05-06 (×2): 60 mg via ORAL
  Filled 2024-05-05: qty 2
  Filled 2024-05-05: qty 1

## 2024-05-05 MED ORDER — FERROUS SULFATE 325 (65 FE) MG PO TABS
325.0000 mg | ORAL_TABLET | ORAL | Status: DC
  Administered 2024-05-05 – 2024-05-07 (×2): 325 mg via ORAL
  Filled 2024-05-05 (×2): qty 1

## 2024-05-05 NOTE — Lactation Note (Signed)
 This note was copied from a baby's chart.  NICU Lactation Consultation Note  Patient Name: Boy Kiyani Jernigan Unijb'd Date: 05/05/2024 Age:28 hours  Reason for consult: Follow-up assessment; Primapara; 1st time breastfeeding; NICU baby; Late-preterm 34-36.6wks; Infant < 6lbs; Other (Comment) (cHTH, Pre-e)  SUBJECTIVE Visited with family of 2 28 weeks old AGA NICU female; Ms. Horsman is a P1 and reported she's been pumping but only got colostrum on her very first pumping session and not the other ones, praised her for all her efforts. Explained the normalcy patterns at these # of hours post-partum and that the importance of pumping this early on is mainly for breast stimulation and not to get volume; she voiced understanding. She was being transferred [facility] to the [facility] during [facility] consult. Asked her to call for assistance when needed.   OBJECTIVE Infant data: Mother's Current Feeding Choice: Breast Milk and Donor Milk  O2 Device: Room Air  Infant feeding assessment IDFS - Readiness: 4   Maternal data: G1P0101 C-Section, Low Transverse Has patient been taught Hand Expression?: Yes Hand Expression Comments: colostrum drop noted Significant Breast History:: ++ breast changes Current breast feeding challenges:: LPTI admitted to NICU Does the patient have breastfeeding experience prior to this delivery?: No Pumping frequency: 3 times/24 hours Pumped volume: -- (droplets) Flange Size: 18 Hands-free pumping top sizes: Large Alejos) Risk factor for low/delayed milk supply:: Infant separation  WIC Program: Yes WIC Referral Sent?: Yes What county?: [place] Pump: Hands Free, Personal (hands free pump through insurance)  ASSESSMENT Infant: Feeding Status: Scheduled 9-12-3-6 Feeding method: Tube/Gavage (Bolus)  Maternal: Milk volume: Normal  INTERVENTIONS/PLAN Interventions: Interventions: Breast feeding basics reviewed; Coconut oil; DEBP; Education Tools: Pump; Flanges;  Hands-free pumping top; Coconut oil Pump Education: Setup, frequency, and cleaning; Milk Storage  Plan: STS around care times Breast massage, hand expression and coconut oil prior to pumping Pump both breasts on initiate mode every 3 hours for 15 minutes; ideally 8 pumping sessions/24 hours  No other support person at this time. All questions and concerns answered, family to contact [facility] services PRN.  Consult Status: NICU follow-up NICU Follow-up type: Maternal D/C visit; Verify onset of copious milk; Verify absence of engorgement   Teralyn Mullins S Navy Rothschild 05/05/2024, 5:54 PM

## 2024-05-05 NOTE — Progress Notes (Signed)

## 2024-05-05 NOTE — Progress Notes (Signed)
 POD1 s/p 1LTCS d/t breech in setting of PreE with SF  Subjective: no complaints, up ad lib, voiding, tolerating PO, and + flatus  Objective: Blood pressure 129/83, pulse 91, temperature 97.9 F (36.6 C), temperature source Oral, resp. rate 18, height 5' 5 (1.651 m), weight 102.2 kg, last menstrual period 09/08/2023, SpO2 98%, unknown if currently breastfeeding.  Physical Exam:  General: alert, cooperative, and no distress Lochia: appropriate Uterine Fundus: firm Incision: healing well DVT Evaluation: No evidence of DVT seen on physical exam.  Recent Labs    05/04/24 1610 05/05/24 0525  HGB 10.2* 9.6*  HCT 30.4* 28.7*    Assessment/Plan: Postpartum - Contraception: POP - MOF: Breast - Rh status: Rh neg, baby is Rh neg - Rubella status: Immune - Dispo: POD2-3 - Consults: Lactation  PreE with SF - s/p Mag - BP was upper mild range overnight. Increased to procardia  60 daily.  - Lasix  and Potassium  Postoperative Anemia - Patient was started on oral iron  therapy for clinically significant but asymptomatic acute postoperative anemia due to expected blood loss.  Neonatal - Doing well - Circumcision: Pt does want. Circ consent not yet done.    LOS: 1 day   Connie Chapman 05/05/2024, 8:02 AM

## 2024-05-06 ENCOUNTER — Other Ambulatory Visit (HOSPITAL_COMMUNITY): Payer: Self-pay

## 2024-05-06 MED ORDER — FUROSEMIDE 20 MG PO TABS
20.0000 mg | ORAL_TABLET | Freq: Every day | ORAL | 0 refills | Status: AC
  Filled 2024-05-06: qty 1, 1d supply, fill #0

## 2024-05-06 MED ORDER — IRON 325 MG PO TABS
1.0000 | ORAL_TABLET | ORAL | 0 refills | Status: AC
  Filled 2024-05-06: qty 12, 84d supply, fill #0

## 2024-05-06 MED ORDER — OXYCODONE HCL 5 MG PO TABS
5.0000 mg | ORAL_TABLET | Freq: Four times a day (QID) | ORAL | 0 refills | Status: AC | PRN
  Filled 2024-05-06: qty 20, 5d supply, fill #0

## 2024-05-06 MED ORDER — ACETAMINOPHEN 500 MG PO TABS
1000.0000 mg | ORAL_TABLET | Freq: Four times a day (QID) | ORAL | 0 refills | Status: AC | PRN
  Filled 2024-05-06: qty 30, 4d supply, fill #0

## 2024-05-06 MED ORDER — POTASSIUM CHLORIDE CRYS ER 20 MEQ PO TBCR
20.0000 meq | EXTENDED_RELEASE_TABLET | Freq: Every day | ORAL | 0 refills | Status: AC
  Filled 2024-05-06: qty 1, 1d supply, fill #0

## 2024-05-06 MED ORDER — SENNOSIDES-DOCUSATE SODIUM 8.6-50 MG PO TABS
2.0000 | ORAL_TABLET | Freq: Every evening | ORAL | 0 refills | Status: AC | PRN
  Filled 2024-05-06: qty 30, 15d supply, fill #0

## 2024-05-06 MED ORDER — NORETHINDRONE 0.35 MG PO TABS
1.0000 | ORAL_TABLET | Freq: Every day | ORAL | 0 refills | Status: AC
  Filled 2024-05-06: qty 84, 84d supply, fill #0

## 2024-05-06 MED ORDER — IBUPROFEN 600 MG PO TABS
600.0000 mg | ORAL_TABLET | Freq: Four times a day (QID) | ORAL | 0 refills | Status: AC | PRN
  Filled 2024-05-06: qty 30, 8d supply, fill #0

## 2024-05-06 MED ORDER — NIFEDIPINE ER 60 MG PO TB24
60.0000 mg | ORAL_TABLET | Freq: Every day | ORAL | 0 refills | Status: DC
  Filled 2024-05-06: qty 30, 30d supply, fill #0

## 2024-05-06 NOTE — Progress Notes (Signed)
 POSTPARTUM PROGRESS NOTE POD #2  Subjective:  Connie Chapman is a 29 y.o. G1P0101 s/p pLTCS at [redacted]w[redacted]d. No acute events overnight. She reports she is doing well. She denies any problems with ambulating, voiding or po intake. Denies nausea or vomiting. She has passed flatus. Pain is well controlled.  Lochia is apporpriate.  Objective: Blood pressure (!) 148/89, pulse 84, temperature 98.2 F (36.8 C), temperature source Oral, resp. rate 18, height 5' 5 (1.651 m), weight 102.2 kg, last menstrual period 09/08/2023, SpO2 100%, unknown if currently breastfeeding.  Physical Exam:  General: alert, cooperative and no distress Heart: regular rate Resp: nonlabored Uterine Fundus: firm, appropriately tender Extremities: No significant calk/ankle Skin: warm, dry; incision clean/dry/intact w/ honeycomb dressing in place  Recent Labs    05/04/24 1610 05/05/24 0525  HGB 10.2* 9.6*  HCT 30.4* 28.7*    Assessment/Plan: Connie Chapman is a 29 y.o. G1P0101 s/p pLTCS at [redacted]w[redacted]d.  POD#1  - Doing well; pain well controlled.  - Routine postpartum care - Lovenox  for VTE prophylaxis - po iron  q2d   preE w/ SF - s/p magnesium  - nifedipine  60mg  - lasix  + K x5d  Feeding: breast Contraception: POP   Dispo: Plan for discharge POD2 or 3.  LOS: 2 days   Barabara Maier, DO FM-OB Fellow [facility] for [employer]

## 2024-05-06 NOTE — Lactation Note (Signed)
 This note was copied from a baby's chart.  NICU Lactation Consultation Note  Patient Name: Connie Chapman Date: 05/06/2024 Age:28 hours  Reason for consult: Follow-up assessment; Late-preterm 34-36.6wks; NICU baby; Infant < 6lbs; 1st time breastfeeding; Primapara; Other (Comment); Maternal discharge (CHTN and Pre-E)  SUBJECTIVE MOB present. MOB informed LC Student Caliegh Middlekauff and LC Mili on harden, pain, and knots of both breast due to one pump session in last 24 hours. MOB allowed us  to view and feel both breast, we noticed right breast was more swollen and had more knots than left breast but were equally full with milk. LC Mili recommended ice packs on both breast 10-15 minutes before and after pump sessions, education on the importance of consistently following pumping schedule, engorgement signs/prevention, and Mattis signs. MOB was educated on pump settings with Medela on how to regulate the modes based on milk output and once she sees 20 ml output or day 5 of pumping.   OBJECTIVE Infant data: Mother's Current Feeding Choice: Breast Milk and Donor Milk  O2 Device: Room Air  Infant feeding assessment IDFS - Readiness: 3   Maternal data: G1P0101 C-Section, Low Transverse Pumping frequency: 1 time in 24/hr Pumped volume: 10 mL  WIC Program: Yes WIC Referral Sent?: Yes What county?: [place] Pump: Hands Free, Personal (hands free pump through insurance)  ASSESSMENT Infant: Feeding Status: Scheduled 9-12-3-6 Feeding method: Tube/Gavage (Bolus)  Maternal: Milk volume: Normal Right side cup size better than left side (asymmetry)  INTERVENTIONS/PLAN Interventions: Interventions: Breast feeding basics reviewed; Coconut oil; DEBP; Education Discharge Education: Engorgement and breast care  Plan: STS around care times Breast massage, hand expression and coconut oil prior and after pumping Pump both breasts on initiate mode every 3 hours for 15 minutes; ideally 8  pumping sessions/24 hours Switch to maintain mode once expressing +20 ml of EBM combined or by day 5 whichever happens first Bring all pump pieces to baby room after her discharge   FOB present at this time. All questions and concerns answered, family to contact [facility] services PRN.  Consult Status: NICU follow-up NICU Follow-up type: Weekly NICU follow up   Marquisha Nikolov 05/06/2024, 1:35 PM

## 2024-05-07 ENCOUNTER — Other Ambulatory Visit (HOSPITAL_COMMUNITY): Payer: Self-pay

## 2024-05-07 MED ORDER — NIFEDIPINE ER OSMOTIC RELEASE 30 MG PO TB24: 60.0000 mg | ORAL_TABLET | Freq: Two times a day (BID) | ORAL | Status: DC

## 2024-05-07 MED ORDER — NIFEDIPINE ER 60 MG PO TB24: 60.0000 mg | ORAL_TABLET | Freq: Every day | ORAL | Status: AC

## 2024-05-07 MED ORDER — LABETALOL HCL 100 MG PO TABS
100.0000 mg | ORAL_TABLET | Freq: Two times a day (BID) | ORAL | 1 refills | Status: AC
  Filled 2024-05-07: qty 60, 30d supply, fill #0

## 2024-05-07 MED ORDER — LABETALOL HCL 100 MG PO TABS
100.0000 mg | ORAL_TABLET | Freq: Two times a day (BID) | ORAL | Status: DC
  Administered 2024-05-07: 100 mg via ORAL
  Filled 2024-05-07: qty 1

## 2024-05-07 MED ORDER — NIFEDIPINE ER OSMOTIC RELEASE 30 MG PO TB24
60.0000 mg | ORAL_TABLET | Freq: Every day | ORAL | Status: DC
  Administered 2024-05-07: 60 mg via ORAL
  Filled 2024-05-07: qty 2
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
## 2024-05-07 NOTE — Lactation Note (Signed)
 This note was copied from a baby's chart. Lactation Consultation Note  Patient Name: Connie Chapman Date: 05/07/2024 Age:29 Reason for consult: Late-preterm 34-36.6wks;NICU baby;Infant < 6lbs;Follow-up assessment;Primapara;1st time breastfeeding;Engorgement Mom is due to for D/C and per mom plans to stay in NICU.  Per mom milk is in and only has pumped off 10 ml each breast so far.  LC reassured mom that is normal for the milk volume to be a roller coaster. Per mom mentioned her breast are firm and LC offered to assess and mom receptive. LC confirmed mom that she is engorgement and provided fresh ice packs with instructions to ice for 20 mins and then pump both breast for 15 mins.  Within in an hour if the breast are filling back up the breast need to be iced again and pump until softened.  LC reviewed supply and demand stressing the importance of being consistent with pumping.   Maternal Data Has patient been taught Hand Expression?: Yes Does the patient have breastfeeding experience prior to this delivery?: No  Feeding Mother's Current Feeding Choice: Breast Milk and Donor Milk  Lactation Tools Discussed/Used Tools: Pump;Flanges;Coconut oil;Hands-free pumping top Flange Size: 21 Breast pump type: Double-Electric Breast Pump Pump Education: Setup, frequency, and cleaning;Milk Storage Pumped volume: 20 mL  Interventions  Education  Discharge Discharge Education: Engorgement and breast care Pump: Hands Free;Personal  Consult Status Consult Status: NICU follow-up Date: 05/07/24 Follow-up type: In-patient    Connie Chapman 05/07/2024, 8:54 AM

## 2024-05-07 NOTE — Discharge Instructions (Signed)
 Continue to check your blood pressures twice a day Call the office for blood pressures that are consistently above 145 for the top number or 95 for the bottom number   Hypertension During Pregnancy Hypertension is also called high blood pressure. High blood pressure means that the force of your blood moving in your body is too strong. It can cause problems for you and your baby. Different types of high blood pressure can happen during pregnancy. The types are: High blood pressure before you got pregnant. This is called chronic hypertension.  This can continue during your pregnancy. Your doctor will want to keep checking your blood pressure. You may need medicine to keep your blood pressure under control while you are pregnant. You will need follow-up visits after you have your baby. High blood pressure that goes up during pregnancy when it was normal before. This is called gestational hypertension. It will usually get better after you have your baby, but your doctor will need to watch your blood pressure to make sure that it is getting better. Very high blood pressure during pregnancy. This is called preeclampsia. Very high blood pressure is an emergency that needs to be checked and treated right away. You may develop very high blood pressure after giving birth. This is called postpartum preeclampsia. This usually occurs within 48 hours after childbirth but may occur up to 6 weeks after giving birth. This is rare. How does this affect me? If you have high blood pressure during pregnancy, you have a higher chance of developing high blood pressure: As you get older. If you get pregnant again. In some cases, high blood pressure during pregnancy can cause: Stroke. Heart attack. Damage to the kidneys, lungs, or liver. Preeclampsia. Jerky movements you cannot control (convulsions or seizures). Problems with the placenta.  What can I do to lower my risk?  Keep a healthy weight. Eat a healthy  diet. Follow what your doctor tells you about treating any medical problems that you had before becoming pregnant. It is very important to go to all of your doctor visits. Your doctor will check your blood pressure and make sure that your pregnancy is progressing as it should. Treatment should start early if a problem is found.  Follow these instructions at home:  Take your blood pressure 1-2 times per day. Call the office if your blood pressure is 155 or higher for the top number or 105 or higher for the bottom number.    Eating and drinking  Drink enough fluid to keep your pee (urine) pale yellow. Avoid caffeine. Lifestyle Do not use any products that contain nicotine or tobacco, such as cigarettes, e-cigarettes, and chewing tobacco. If you need help quitting, ask your doctor. Do not use alcohol or drugs. Avoid stress. Rest and get plenty of sleep. Regular exercise can help. Ask your doctor what kinds of exercise are best for you. General instructions Take over-the-counter and prescription medicines only as told by your doctor. Keep all prenatal and follow-up visits as told by your doctor. This is important. Contact a doctor if: You have symptoms that your doctor told you to watch for, such as: Headaches. Nausea. Vomiting. Belly (abdominal) pain. Dizziness. Light-headedness. Get help right away if: You have: Very bad belly pain that does not get better with treatment. A very bad headache that does not get better. Vomiting that does not get better. Sudden, fast weight gain. Sudden swelling in your hands, ankles, or face. Blood in your pee. Blurry vision. Double vision.  Shortness of breath. Chest pain. Weakness on one side of your body. Trouble talking. Summary High blood pressure is also called hypertension. High blood pressure means that the force of your blood moving in your body is too strong. High blood pressure can cause problems for you and your baby. Keep all  follow-up visits as told by your doctor. This is important. This information is not intended to replace advice given to you by your health care provider. Make sure you discuss any questions you have with your health care provider. Document Released: 05/02/2010 Document Revised: 07/21/2018 Document Reviewed: 04/26/2018 Elsevier Patient Education  2020 [employer].

## 2024-05-08 ENCOUNTER — Encounter (HOSPITAL_COMMUNITY): Payer: Self-pay | Admitting: Obstetrics and Gynecology

## 2024-05-08 ENCOUNTER — Other Ambulatory Visit: Payer: Self-pay

## 2024-05-08 ENCOUNTER — Inpatient Hospital Stay (HOSPITAL_COMMUNITY)
Admission: AD | Admit: 2024-05-08 | Discharge: 2024-05-08 | Disposition: A | Discharge status: Home / Self Care | Attending: Obstetrics and Gynecology | Admitting: Obstetrics and Gynecology

## 2024-05-08 DIAGNOSIS — R5383 Other fatigue: Secondary | ICD-10-CM | POA: Diagnosis not present

## 2024-05-08 DIAGNOSIS — R42 Dizziness and giddiness: Secondary | ICD-10-CM

## 2024-05-08 DIAGNOSIS — Z79899 Other long term (current) drug therapy: Secondary | ICD-10-CM | POA: Diagnosis not present

## 2024-05-08 DIAGNOSIS — Z98891 History of uterine scar from previous surgery: Secondary | ICD-10-CM

## 2024-05-08 DIAGNOSIS — Z7951 Long term (current) use of inhaled steroids: Secondary | ICD-10-CM | POA: Diagnosis not present

## 2024-05-08 DIAGNOSIS — O9089 Other complications of the puerperium, not elsewhere classified: Secondary | ICD-10-CM | POA: Insufficient documentation

## 2024-05-08 LAB — CBC WITH DIFFERENTIAL/PLATELET
Abs Immature Granulocytes: 0.13 10*3/uL — ABNORMAL HIGH (ref 0.00–0.07)
Basophils Absolute: 0.1 10*3/uL (ref 0.0–0.1)
Basophils Relative: 0 %
Eosinophils Absolute: 0.7 10*3/uL — ABNORMAL HIGH (ref 0.0–0.5)
Eosinophils Relative: 5 %
HCT: 32.1 % — ABNORMAL LOW (ref 36.0–46.0)
Hemoglobin: 10.8 g/dL — ABNORMAL LOW (ref 12.0–15.0)
Immature Granulocytes: 1 %
Lymphocytes Relative: 18 %
Lymphs Abs: 2.4 10*3/uL (ref 0.7–4.0)
MCH: 30.1 pg (ref 26.0–34.0)
MCHC: 33.6 g/dL (ref 30.0–36.0)
MCV: 89.4 fL (ref 80.0–100.0)
Monocytes Absolute: 0.7 10*3/uL (ref 0.1–1.0)
Monocytes Relative: 5 %
Neutro Abs: 9.5 10*3/uL — ABNORMAL HIGH (ref 1.7–7.7)
Neutrophils Relative %: 71 %
Platelets: 276 10*3/uL (ref 150–400)
RBC: 3.59 MIL/uL — ABNORMAL LOW (ref 3.87–5.11)
RDW: 13.5 % (ref 11.5–15.5)
WBC: 13.4 10*3/uL — ABNORMAL HIGH (ref 4.0–10.5)
nRBC: 0 % (ref 0.0–0.2)

## 2024-05-08 LAB — SURGICAL PATHOLOGY

## 2024-05-08 NOTE — MAU Provider Note (Signed)
 " History     CSN: [id number]  Arrival date and time: 05/08/24 1037   Event Date/Time   First Provider Initiated Contact with Patient 05/08/24 1119      Chief Complaint  Patient presents with   Dizziness   Fatigue   Dizziness Pertinent negatives include no abdominal pain, chest pain, chills, congestion, coughing, fever, nausea, rash, sore throat or vomiting.   Patient is 29 y.o. G1P0101 POD#4 after PLTCS for [place]ch with Severe Preeclampsia here with complaints of dizziness and lightheadedness. She slept last night in the NICU. She reports waking in normal state of health but was more sore because she slept on the couch.   Took meds this AM and reported sx shortly after that @0830  she took ibuprofen , tylenol , oxycodone . @0900  she took lasix , potassium, procardia , labetalol , and iron .   She then went to the cafeteria and reports while there she felt heavy and dizzy.   Denies worsening abdominal pain- does have baseline CS related discomfort, fever.    OB History     Gravida  1   Para  1   Term  0   Preterm  1   AB  0   Living  1      SAB  0   IAB  0   Ectopic  0   Multiple  0   Live Births  1           Past Medical History:  Diagnosis Date   ADD (attention deficit disorder with hyperactivity)    Asthma    Endometriosis    Seasonal allergies    Wears glasses    Goes to Dr. Geronimo Slack   Wears glasses     Past Surgical History:  Procedure Laterality Date   ADENOIDECTOMY     CESAREAN SECTION N/A 05/04/2024   Procedure: CESAREAN DELIVERY;  Surgeon: Cleatus Moccasin, MD;  Location: [facility];  Service: Obstetrics;  Laterality: N/A;   TONSILLECTOMY  2007   TYMPANOSTOMY TUBE PLACEMENT  2000, 2004   TYMPANOSTOMY TUBE PLACEMENT      Family History  Problem Relation Age of Onset   Hearing loss Mother    Scoliosis Mother    Endometriosis Mother    Hypertension Mother    Other Mother        Tumor Cerebri   Hyperlipidemia Father    Sleep apnea  Father    Hypertension Other    Thyroid disease Other    Heart attack Other     Social History[1]  Allergies: Allergies[2]  Medications Prior to Admission  Medication Sig Dispense Refill Last Dose/Taking   acetaminophen  (TYLENOL ) 500 MG tablet Take 2 tablets (1,000 mg total) by mouth every 6 (six) hours as needed for moderate pain (pain score 4-6). 30 tablet 0 05/08/2024 at  8:30 AM   Ferrous Sulfate  (IRON ) 325 MG TABS Take 1 tablet by mouth every Monday, Wednesday, and Friday. 30 tablet 0 05/08/2024 at  9:00 AM   furosemide  (LASIX ) 20 MG tablet Take 1 tablet (20 mg total) by mouth daily. 1 tablet 0 05/08/2024 at  9:00 AM   ibuprofen  (ADVIL ) 600 MG tablet Take 1 tablet (600 mg total) by mouth every 6 (six) hours as needed for moderate pain (pain score 4-6). 30 tablet 0 05/08/2024 at  8:30 AM   labetalol  (NORMODYNE ) 100 MG tablet Take 1 tablet (100 mg total) by mouth 2 (two) times daily. 60 tablet 1 05/08/2024 at  9:00 AM   NIFEdipine  (ADALAT  CC)  60 MG 24 hr tablet Take 1 tablet (60 mg total) by mouth daily.   05/08/2024 at  9:00 AM   oxyCODONE  (OXY IR/ROXICODONE ) 5 MG immediate release tablet Take 1 tablet (5 mg total) by mouth every 6 (six) hours as needed for severe pain (pain score 7-10). 20 tablet 0 05/08/2024 at  8:30 AM   potassium chloride  SA (KLOR-CON  M) 20 MEQ tablet Take 1 tablet (20 mEq total) by mouth daily. 1 tablet 0 05/08/2024 at  9:00 AM   albuterol  (VENTOLIN  HFA) 108 (90 Base) MCG/ACT inhaler Inhale 1-2 puffs into the lungs every 6 (six) hours as needed. 8 g 0    Blood Pressure Monitoring (BLOOD PRESSURE KIT) DEVI 1 Device by Does not apply route once a week. 1 each 0    fluticasone  (FLOVENT  HFA) 110 MCG/ACT inhaler Inhale 2 puffs into the lungs in the morning and at bedtime. 12 g 1    norethindrone  (MICRONOR ) 0.35 MG tablet Take 1 tablet (0.35 mg total) by mouth daily. 84 tablet 0    Prenatal Vit-Fe Fumarate-FA (PRENATAL VITAMIN PO) Take 1 tablet by mouth daily.      senna-docusate  (SENOKOT-S) 8.6-50 MG tablet Take 2 tablets by mouth at bedtime as needed for mild constipation or moderate constipation. 30 tablet 0     Review of Systems  Constitutional:  Negative for chills and fever.  HENT:  Negative for congestion and sore throat.   Eyes:  Negative for pain and visual disturbance.  Respiratory:  Negative for cough, chest tightness and shortness of breath.   Cardiovascular:  Negative for chest pain.  Gastrointestinal:  Negative for abdominal pain, diarrhea, nausea and vomiting.  Endocrine: Negative for cold intolerance and heat intolerance.  Genitourinary:  Negative for dysuria and flank pain.  Musculoskeletal:  Negative for back pain.  Skin:  Negative for rash.  Allergic/Immunologic: Negative for food allergies.  Neurological:  Positive for dizziness. Negative for light-headedness.  Psychiatric/Behavioral:  Negative for agitation.    Physical Exam   Blood pressure 120/62, pulse (!) 102, temperature 98.1 F (36.7 C), temperature source Oral, resp. rate 15, height 5' 5 (1.651 m), weight 89.8 kg, SpO2 97%, currently breastfeeding.  Physical Exam Vitals and nursing note reviewed.  Constitutional:      General: She is not in acute distress.    Appearance: She is well-developed.  HENT:     Head: Normocephalic and atraumatic.  Eyes:     General: No scleral icterus.    Conjunctiva/sclera: Conjunctivae normal.  Cardiovascular:     Rate and Rhythm: Normal rate.  Pulmonary:     Effort: Pulmonary effort is normal.  Chest:     Chest wall: No tenderness.  Abdominal:     Palpations: Abdomen is soft.     Tenderness: There is no abdominal tenderness. There is no guarding or rebound.  Genitourinary:    Vagina: Normal.  Musculoskeletal:        General: Normal range of motion.     Cervical back: Normal range of motion and neck supple.  Skin:    General: Skin is warm and dry.     Findings: No rash.  Neurological:     Mental Status: She is alert and oriented to  person, place, and time.     MAU Course  Procedures  MDM- moderate Ddx included medication side effect, symptomatic anemia, neurological condition. Most likely medication side effect given timing of symptoms and taking her morning medications. Results for orders placed or performed during  the hospital encounter of 05/08/24 (from the past 24 hours)  CBC with Differential     Status: Abnormal   Collection Time: 05/08/24 11:48 AM  Result Value Ref Range   WBC 13.4 (H) 4.0 - 10.5 K/uL   RBC 3.59 (L) 3.87 - 5.11 MIL/uL   Hemoglobin 10.8 (L) 12.0 - 15.0 g/dL   HCT 67.8 (L) 63.9 - 53.9 %   MCV 89.4 80.0 - 100.0 fL   MCH 30.1 26.0 - 34.0 pg   MCHC 33.6 30.0 - 36.0 g/dL   RDW 86.4 88.4 - 84.4 %   Platelets 276 150 - 400 K/uL   nRBC 0.0 0.0 - 0.2 %   Neutrophils Relative % 71 %   Neutro Abs 9.5 (H) 1.7 - 7.7 K/uL   Lymphocytes Relative 18 %   Lymphs Abs 2.4 0.7 - 4.0 K/uL   Monocytes Relative 5 %   Monocytes Absolute 0.7 0.1 - 1.0 K/uL   Eosinophils Relative 5 %   Eosinophils Absolute 0.7 (H) 0.0 - 0.5 K/uL   Basophils Relative 0 %   Basophils Absolute 0.1 0.0 - 0.1 K/uL   Immature Granulocytes 1 %   Abs Immature Granulocytes 0.13 (H) 0.00 - 0.07 K/uL   - Medications were reviewed in full, adjusted timing of medications - Neuro exam reassuring    Assessment and Plan   1. Dizziness   2. S/P primary low transverse C-section    Discharge home stable condition Hemoglobin is stable and thus do not feel this is symptomatic anemia Instructed to take Tylenol  and ibuprofen  when first waking up to help with soreness that developed overnight.  Recommended then having breakfast.  Recommended taking all blood pressure medicines shortly after eating. If pain is still present 1 hour after taking Tylenol  and ibuprofen  recommended using the oxycodone  at that time Reviewed causes of dizziness in detail Patient feels comfortable with discharge.   Connie Chapman 05/08/2024, 11:19 AM       [1]  Social History Tobacco Use   Smoking status: Never    Passive exposure: Yes   Smokeless tobacco: Never  Vaping Use   Vaping status: Former   Start date: 10/10/2017   Substances: Nicotine, Flavoring  Substance Use Topics   Alcohol use: Not Currently    Comment: occ   Drug use: Never  [2]  Allergies Allergen Reactions   Codeine  Nausea And Vomiting   "

## 2024-05-08 NOTE — Discharge Instructions (Addendum)
 I recommend you eat breakfast before you take your medication  - Try to take your blood pressure medications (Labetalol /Procardia /Lasix  and potassium) with the tylenol  and ibuprofen  - In 30 min to 1 hour after your take these medications if you still have pain, take the oxycodone .

## 2024-05-08 NOTE — MAU Note (Addendum)
 MAU Triage Note  Connie Chapman is a 29 y.o. a PP C/S patient here in MAU reporting: was discharged yesterday morning; reports dizziness and generalized weakness that began around 9:30/10am shortly after she took her Rx medications.  @0900  she took lasix , potassium, procardia , labetalol , and iron .   @0830  she took ibuprofen , tylenol , oxycodone .   Onset of complaint: 0930/10am Pain score: denies Vitals:   05/08/24 1049  BP: (!) 116/58  Pulse: (!) 103  Resp: 15  Temp: 98.1 F (36.7 C)  SpO2: 97%      Lab orders placed from triage: none

## 2024-05-09 NOTE — Lactation Note (Signed)
 This note was copied from a baby's chart.  NICU Lactation Consultation Note  Patient Name: Boy Shanayah Kaffenberger Unijb'd Date: 05/09/2024 Age:28 days  Reason for consult: Follow-up assessment; Primapara; 1st time breastfeeding; NICU baby; Late-preterm 34-36.6wks; Infant < 6lbs; Other (Comment) (cHTN, Pre-E)  SUBJECTIVE Visited with family of 54 58/18 weeks old AGA NICU female; Ms. Antwine is a P1 and reported she's been pumping more often now although she went all night without pumping, she was exhausted. She voiced that after going 8-9 hours without pumping she didn't have to ice her breasts; the pump took care of everything. Re-educated about the importance of consistent emptying of the breast for the prevention of engorgement and to protect her supply. Let her know that if her breasts are leaking is probably time to pump. Family requested breast pads, and additional bottles/labels, they were provided.   OBJECTIVE Infant data: Mother's Current Feeding Choice: Breast Milk and Donor Milk  O2 Device: Room Air  Infant feeding assessment IDFS - Readiness: 3 IDFS - Quality: 3   Maternal data: G1P0101 C-Section, Low Transverse Pumping frequency: 5 times/24 hours Pumped volume: 30 mL (30-45)  WIC Program: Yes WIC Referral Sent?: Yes What county?: [place] Pump: Hands Free, Personal (wearable pump through insurance)  ASSESSMENT Infant: Feeding Status: Scheduled 9-12-3-6 Feeding method: Tube/Gavage (Bolus) Nipple Type: Pacifier dips  Maternal: Milk volume: Normal  INTERVENTIONS/PLAN Interventions: Interventions: Breast feeding basics reviewed; Coconut oil; DEBP; Education  Plan: STS around care times Pump both breasts on maintain mode every 3 hours for 20-30 minutes; ideally 8 pumping sessions/24 hours   FOB and MGM present and supportive. All questions and concerns answered, family to contact [facility] services PRN.   Consult Status: NICU follow-up NICU Follow-up type: Weekly NICU  follow up   Frederic Tones GORMAN Crate 05/09/2024, 4:38 PM

## 2024-05-11 ENCOUNTER — Ambulatory Visit: Payer: Self-pay

## 2024-05-11 ENCOUNTER — Encounter: Payer: Self-pay | Admitting: Obstetrics

## 2024-05-12 ENCOUNTER — Other Ambulatory Visit

## 2024-05-12 ENCOUNTER — Ambulatory Visit
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
## 2024-05-15 ENCOUNTER — Telehealth (HOSPITAL_COMMUNITY): Payer: Self-pay | Admitting: *Deleted

## 2024-05-15 ENCOUNTER — Ambulatory Visit: Payer: Self-pay

## 2024-05-17 NOTE — Lactation Note (Signed)
 This note was copied from a baby's chart.  NICU Lactation Consultation Note  Patient Name: Boy Mercadez Heitman Unijb'd Date: 05/17/2024 Age:28 days  Reason for consult: Follow-up assessment; RN request; Other (Comment); 1st time breastfeeding; Primapara; NICU baby; Late-preterm 34-36.6wks  SUBJECTIVE  LC in to assist/assess baby at the breast at the 6 pm feeding.  Mom had baby in football hold on right breast with a shallow latch.  LC offered to assist and Mom agreeable.  Baby showing some cues, but not vigorously.  Baby unable to attain a deep latch.  LC asked about the 16 mm nipple shield.  Mom stated that she didn't like it as it was falling off.  LC asked if she was inverting nipple shield first and stretching it over the nipple.  Mom said no.  LC offered to show Mom and FOB.    LC showed how to apply the nipple shield to ensure a good fit and attachment.  16 mm nipple shield appeared to fit well.  With guidance and assistance, Mom was able to latch baby onto the breast where he was noted to be sucking with most non-nutritive sucking.  Baby noted to have one swallow with a pause.  LC showed Mom how to do alternate breast compression to increase milk transfer.  Baby coughed after doing this.  LC helped Mom take baby off and into a seated upright position where she burped him.  Baby too sleepy to continue and Mom encouraged to hold baby STS on her chest while RN starting gavage feeding.  Mom encouraged to pump after feeding and continue her consistent pumping.  LC noticed flange of 24 mm in her drying basin.  LC provided 18 mm flanges and encouraged Mom to pump after doing STS.  Medium size pumping band provided and RN states she will assist with cutting the hole for the flanges.  OBJECTIVE Infant data: No data recorded O2 Device: Room Air  Infant feeding assessment IDFS - Readiness: 2 IDFS - Quality: 3   Maternal data: G1P0101 C-Section, Low Transverse Pumping frequency: 6-8 times  per 24 hrs Pumped volume: 45 mL Flange Size: 18 Hands-free pumping top sizes: Large [place])  WIC Program: Yes WIC Referral Sent?: Yes What county?: [place] Pump: Hands Free, Personal (Motif wearable through insurance)  ASSESSMENT Infant: Latch: Repeated attempts needed to sustain latch, nipple held in mouth throughout feeding, stimulation needed to elicit sucking reflex. Audible Swallowing: A few with stimulation (one swallow identified, baby coughed and came off the breast) Type of Nipple: Everted at rest and after stimulation Comfort (Breast/Nipple): Soft / non-tender Hold (Positioning): Assistance needed to correctly position infant at breast and maintain latch. LATCH Score: 7  Feeding Status: Scheduled 9-12-3-6 Feeding method: Breast Nipple Type: Dr. Jonna Fling Preemie  Maternal: Milk volume: Low  INTERVENTIONS/PLAN Interventions: Interventions: Breast feeding basics reviewed; Assisted with latch; Skin to skin; Breast massage; Hand express; Adjust position; Breast compression; Support pillows; Position options; DEBP; Education Tools: Pump; Flanges; Nipple Delene; Hands-free pumping top Pump Education: Setup, frequency, and cleaning; Milk Storage Nipple shield size: 16  Plan: Consult Status: NICU follow-up NICU Follow-up type: Verify absence of engorgement; Verify onset of copious milk; Weekly NICU follow up   Claudene Aleck BRAVO 05/17/2024, 6:35 PM

## 2024-05-18 ENCOUNTER — Ambulatory Visit

## 2024-05-18 ENCOUNTER — Other Ambulatory Visit

## 2024-05-22 ENCOUNTER — Ambulatory Visit

## 2024-06-15 ENCOUNTER — Ambulatory Visit: Payer: Self-pay | Admitting: Obstetrics and Gynecology
